# Patient Record
Sex: Female | Born: 2000 | Race: White | Hispanic: No | Marital: Single | Smoking: Never smoker
Health system: Southern US, Community
[De-identification: ages and names within clinical notes are randomized; demographics above are authoritative.]

## PROBLEM LIST (undated history)

## (undated) DIAGNOSIS — Q676 Pectus excavatum: Secondary | ICD-10-CM

## (undated) DIAGNOSIS — F840 Autistic disorder: Secondary | ICD-10-CM

## (undated) DIAGNOSIS — F419 Anxiety disorder, unspecified: Secondary | ICD-10-CM

## (undated) HISTORY — DX: Autistic disorder: F84.0

## (undated) HISTORY — DX: Anxiety disorder, unspecified: F41.9

---
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
## 2022-03-15 NOTE — Progress Notes (Signed)
BP 96/64   Pulse 62   Temp 98 F (36.7 C) (Oral)   Ht 5' 1.42" (1.56 m)   Wt 99 lb (44.9 kg)   LMP 02/28/2022 (Approximate)   SpO2 100%   BMI 18.45 kg/m    Subjective:    Patient ID: Rhonda Fox, female    DOB: 10/07/2000, 21 y.o.   MRN: [id number]  HPI: Rhonda Fox is a 21 y.o. female  Chief Complaint  Patient presents with   Establish Care    Patient would like to discuss fatigue and constant bodyaches   Patient presents to clinic to establish care with new PCP.  Introduced to Publishing rights manager role and practice setting.  All questions answered.  Discussed provider/patient relationship and expectations.  Patient reports a history of Depression and anxiety, headaches.  Patient denies a history of: Hypertension, Elevated Cholesterol, Diabetes, Thyroid problems, Depression, Anxiety, Neurological problems, and Abdominal problems.   FATIGUE Duration:   years Severity: 8/10  Onset: gradual Context when symptoms started:  unknown Symptoms improve with rest: no  Depressive symptoms: yes Stress/anxiety: yes Insomnia: yes  hard to fall asleep and stay asleep Snoring: no Observed apnea by bed partner: no Daytime hypersomnolence:yes Wakes feeling refreshed: no History of sleep study: no Dysnea on exertion:   possible asthma Orthopnea/PND: no Chest pain: no Chronic cough: yes Lower extremity edema: no Arthralgias:yes Myalgias: yes Weakness: no Rash: no Denies weight loss or weight gain. Has a family history of  Has been in psych facility 2-3 times. Has possible diagnosis of autism.  Patient has been evaluated by psychiatry as a 32 year old.  Mom states she was on Abilify in the past and it caused her to hallucinate.  States they thought she was schizophrenic.     Active Ambulatory Problems    Diagnosis Date Noted   Depression, recurrent (HCC) 03/16/2022   Other fatigue 03/16/2022   Resolved Ambulatory Problems    Diagnosis Date Noted   No Resolved  Ambulatory Problems   Past Medical History:  Diagnosis Date   Pectus excavatum    History reviewed. No pertinent surgical history. History reviewed. No pertinent family history.   Review of Systems  Constitutional:  Positive for fatigue.  Musculoskeletal:  Positive for arthralgias and myalgias.  Psychiatric/Behavioral:  Positive for dysphoric mood and sleep disturbance. Negative for suicidal ideas. The patient is nervous/anxious.     Per HPI unless specifically indicated above     Objective:    BP 96/64   Pulse 62   Temp 98 F (36.7 C) (Oral)   Ht 5' 1.42" (1.56 m)   Wt 99 lb (44.9 kg)   LMP 02/28/2022 (Approximate)   SpO2 100%   BMI 18.45 kg/m   Wt Readings from Last 3 Encounters:  03/16/22 99 lb (44.9 kg) (3 %, Z= -1.86)*  09/04/21 96 lb 6.4 oz (43.7 kg) (2 %, Z= -2.07)*  05/14/21 92 lb (41.7 kg) (<1 %, Z= -2.51)*   * Growth percentiles are based on CDC (Girls, 2-20 Years) data.    Physical Exam Vitals and nursing note reviewed.  Constitutional:      General: She is not in acute distress.    Appearance: Normal appearance. She is normal weight. She is not ill-appearing, toxic-appearing or diaphoretic.  HENT:     Head: Normocephalic.     Right Ear: External ear normal.     Left Ear: External ear normal.     Nose: Nose normal.  Mouth/Throat:     Mouth: Mucous membranes are moist.     Pharynx: Oropharynx is clear.  Eyes:     General:        Right eye: No discharge.        Left eye: No discharge.     Extraocular Movements: Extraocular movements intact.     Conjunctiva/sclera: Conjunctivae normal.     Pupils: Pupils are equal, round, and reactive to light.  Cardiovascular:     Rate and Rhythm: Normal rate and regular rhythm.     Heart sounds: No murmur heard. Pulmonary:     Effort: Pulmonary effort is normal. No respiratory distress.     Breath sounds: Normal breath sounds. No wheezing or rales.  Musculoskeletal:     Cervical back: Normal range of  motion and neck supple.  Skin:    General: Skin is warm and dry.     Capillary Refill: Capillary refill takes less than 2 seconds.  Neurological:     General: No focal deficit present.     Mental Status: She is alert and oriented to person, place, and time. Mental status is at baseline.  Psychiatric:        Mood and Affect: Mood normal.        Behavior: Behavior normal.        Thought Content: Thought content normal.        Judgment: Judgment normal.     Results for orders placed or performed in visit on 10/06/21  Hepatitis B surface antigen  Result Value Ref Range   Hepatitis B Surface Ag      surface antigen non reactive, surface antibody reactive      Assessment & Plan:   Problem List Items Addressed This Visit       Other   Depression, recurrent ([place]) - Primary    Chronic. Not well controlled.  Patient has had multiple pysch diagnosis over the years.  Will refer to psychiatry for formal evaluation and treatment.  Follow up in 1 month. Call sooner if concerns arise.       Relevant Medications   traZODone (DESYREL) 50 MG tablet   Other Relevant Orders   Ambulatory referral to Psychiatry   Other fatigue    On going concern.  Has been happening for several years.  Patient does not have a good schedule.  Encouraged patient to get on regular schedule to help with sleep.  Will try Trazadone 50mg  nightly.  Discussed side effects and benefits of medication discussed during visit. Discussed risk of Suicidal Ideations. Labs ordered at visit today.  Follow up in 1 month.  Call sooner if concerns arise.       Relevant Orders   Rheumatoid factor   ANA   ANA+ENA+DNA/DS+Antich+Centr   CK   Rocky mtn spotted fvr abs pnl(IgG+IgM)   Uric acid   Ehrlichia antibody panel   Babesia microti Antibody Panel   Comprehensive metabolic panel   CBC with Differential/Platelet   Lyme Disease Serology w/Reflex   Thyroid peroxidase antibody   Vitamin D (25 hydroxy)   Other Visit Diagnoses      Myalgia       Labs ordered at visit today. Follow up in 1 month. Call sooner if concerns arise.   Relevant Orders   Rheumatoid factor   ANA   ANA+ENA+DNA/DS+Antich+Centr   CK   Rocky mtn spotted fvr abs pnl(IgG+IgM)   Uric acid   Ehrlichia antibody panel   Babesia microti Antibody Panel  Comprehensive metabolic panel   CBC with Differential/Platelet   Lyme Disease Serology w/Reflex   Thyroid Panel With TSH   Thyroid peroxidase antibody   Vitamin D (25 hydroxy)   Arthralgia, unspecified joint       Labs ordered at visit today. Follow up in 1 month. Call sooner if concerns arise.   Relevant Orders   Rheumatoid factor   ANA   ANA+ENA+DNA/DS+Antich+Centr   CK   Rocky mtn spotted fvr abs pnl(IgG+IgM)   Uric acid   Ehrlichia antibody panel   Babesia microti Antibody Panel   Comprehensive metabolic panel   CBC with Differential/Platelet   Lyme Disease Serology w/Reflex   Thyroid Panel With TSH   Thyroid peroxidase antibody   Vitamin D (25 hydroxy)   Encounter to establish care            Follow up plan: Return in about 1 month (around 04/15/2022) for Physical and Fasting labs.

## 2022-03-16 ENCOUNTER — Ambulatory Visit (INDEPENDENT_AMBULATORY_CARE_PROVIDER_SITE_OTHER): Payer: Medicaid Other | Admitting: Nurse Practitioner

## 2022-03-16 ENCOUNTER — Encounter: Payer: Self-pay | Admitting: Nurse Practitioner

## 2022-03-16 VITALS — BP 96/64 | HR 62 | Temp 98.0°F | Ht 61.42 in | Wt 99.0 lb

## 2022-03-16 DIAGNOSIS — R5383 Other fatigue: Secondary | ICD-10-CM | POA: Insufficient documentation

## 2022-03-16 DIAGNOSIS — F339 Major depressive disorder, recurrent, unspecified: Secondary | ICD-10-CM | POA: Diagnosis not present

## 2022-03-16 DIAGNOSIS — M2550 Pain in unspecified joint: Secondary | ICD-10-CM | POA: Diagnosis not present

## 2022-03-16 DIAGNOSIS — M791 Myalgia, unspecified site: Secondary | ICD-10-CM

## 2022-03-16 DIAGNOSIS — Z7689 Persons encountering health services in other specified circumstances: Secondary | ICD-10-CM

## 2022-03-16 MED ORDER — TRAZODONE HCL 50 MG PO TABS: 25.0000 mg | ORAL_TABLET | Freq: Every evening | ORAL | 1 refills | Status: DC | PRN

## 2022-03-16 NOTE — Assessment & Plan Note (Signed)
Chronic. Not well controlled.  Patient has had multiple pysch diagnosis over the years.  Will refer to psychiatry for formal evaluation and treatment.  Follow up in 1 month. Call sooner if concerns arise.

## 2022-03-16 NOTE — Assessment & Plan Note (Signed)
On going concern.  Has been happening for several years.  Patient does not have a good schedule.  Encouraged patient to get on regular schedule to help with sleep.  Will try Trazadone 50mg  nightly.  Discussed side effects and benefits of medication discussed during visit. Discussed risk of Suicidal Ideations. Labs ordered at visit today.  Follow up in 1 month.  Call sooner if concerns arise.

## 2022-03-17 ENCOUNTER — Encounter: Payer: Self-pay | Admitting: Nurse Practitioner

## 2022-03-20 MED ORDER — DOXYCYCLINE HYCLATE 100 MG PO TABS: 100.0000 mg | ORAL_TABLET | Freq: Two times a day (BID) | ORAL | 0 refills | Status: DC

## 2022-03-20 NOTE — Progress Notes (Signed)
Hi Rhonda Fox.  Some of your lab work hasn't come back yet.  However, the ones that are back show that you have a low vitamin D level.  I want you to start Vitamin D 1000 IU over the counter daily.   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  This is likely a big source of your symptoms.  I have sent in a prescription for Doxycyline to help treat this. You will take it twice a day for 21 days.  Make sure to take it with food.    The other labs are unremarkable and don't explain your symptoms.  There does not appear to be any autoimmune disorder going on.

## 2022-03-20 NOTE — Addendum Note (Signed)
Addended by: Jon Billings on: 03/20/2022 07:52 AM   Modules accepted: Orders

## 2022-03-21 NOTE — Progress Notes (Signed)
Rhonda Fox. The rest of your lab work is unremarkable.  Continue with the antibiotics as discussed.

## 2022-03-22 LAB — THYROID PANEL WITH TSH
Free Thyroxine Index: 1.9 (ref 1.2–4.9)
T3 Uptake Ratio: 25 % (ref 24–39)
T4, Total: 7.7 ug/dL (ref 4.5–12.0)
TSH: 3.6 u[IU]/mL (ref 0.450–4.500)

## 2022-03-22 LAB — COMPREHENSIVE METABOLIC PANEL
ALT: 29 IU/L (ref 0–32)
AST: 25 IU/L (ref 0–40)
Albumin/Globulin Ratio: 2 (ref 1.2–2.2)
Albumin: 5 g/dL (ref 4.0–5.0)
Alkaline Phosphatase: 72 IU/L (ref 42–106)
BUN/Creatinine Ratio: 19 (ref 9–23)
BUN: 10 mg/dL (ref 6–20)
Bilirubin Total: <0.2 mg/dL (ref 0.0–1.2)
CO2: 23 mmol/L (ref 20–29)
Calcium: 10 mg/dL (ref 8.7–10.2)
Chloride: 102 mmol/L (ref 96–106)
Creatinine, Ser: 0.54 mg/dL — ABNORMAL LOW (ref 0.57–1.00)
Globulin, Total: 2.5 g/dL (ref 1.5–4.5)
Glucose: 87 mg/dL (ref 70–99)
Potassium: 4.3 mmol/L (ref 3.5–5.2)
Sodium: 139 mmol/L (ref 134–144)
Total Protein: 7.5 g/dL (ref 6.0–8.5)
eGFR: 136 mL/min/{1.73_m2} (ref >59)

## 2022-03-22 LAB — ANA+ENA+DNA/DS+ANTICH+CENTR
ANA Titer 1: NEGATIVE
Anti JO-1: <0.2 AI (ref 0.0–0.9)
Centromere Ab Screen: <0.2 AI (ref 0.0–0.9)
Chromatin Ab SerPl-aCnc: <0.2 AI (ref 0.0–0.9)
ENA RNP Ab: 0.3 AI (ref 0.0–0.9)
ENA SM Ab Ser-aCnc: <0.2 AI (ref 0.0–0.9)
ENA SSA (RO) Ab: <0.2 AI (ref 0.0–0.9)
ENA SSB (LA) Ab: <0.2 AI (ref 0.0–0.9)
Scleroderma (Scl-70) (ENA) Antibody, IgG: <0.2 AI (ref 0.0–0.9)
dsDNA Ab: 1 IU/mL (ref 0–9)

## 2022-03-22 LAB — CBC WITH DIFFERENTIAL/PLATELET
Basophils Absolute: 0 10*3/uL (ref 0.0–0.2)
Basos: 0 %
EOS (ABSOLUTE): 0.1 10*3/uL (ref 0.0–0.4)
Eos: 2 %
Hematocrit: 39.6 % (ref 34.0–46.6)
Hemoglobin: 13.5 g/dL (ref 11.1–15.9)
Immature Grans (Abs): 0 10*3/uL (ref 0.0–0.1)
Immature Granulocytes: 0 %
Lymphocytes Absolute: 1.6 10*3/uL (ref 0.7–3.1)
Lymphs: 32 %
MCH: 33.1 pg — ABNORMAL HIGH (ref 26.6–33.0)
MCHC: 34.1 g/dL (ref 31.5–35.7)
MCV: 97 fL (ref 79–97)
Monocytes Absolute: 0.4 10*3/uL (ref 0.1–0.9)
Monocytes: 9 %
Neutrophils Absolute: 2.8 10*3/uL (ref 1.4–7.0)
Neutrophils: 57 %
Platelets: 272 10*3/uL (ref 150–450)
RBC: 4.08 x10E6/uL (ref 3.77–5.28)
RDW: 10.6 % — ABNORMAL LOW (ref 11.7–15.4)
WBC: 5 10*3/uL (ref 3.4–10.8)

## 2022-03-22 LAB — RHEUMATOID FACTOR: Rheumatoid fact SerPl-aCnc: <10 IU/mL (ref <14.0)

## 2022-03-22 LAB — URIC ACID: Uric Acid: 3.4 mg/dL (ref 2.6–6.2)

## 2022-03-22 LAB — BABESIA MICROTI ANTIBODY PANEL
Babesia microti IgG: <1:10 {titer}
Babesia microti IgM: <1:10 {titer}

## 2022-03-22 LAB — ANA: Anti Nuclear Antibody (ANA): NEGATIVE

## 2022-03-22 LAB — CK: Total CK: 27 U/L — ABNORMAL LOW (ref 32–182)

## 2022-03-22 LAB — EHRLICHIA ANTIBODY PANEL
E. Chaffeensis (HME) IgM Titer: NEGATIVE
E.Chaffeensis (HME) IgG: NEGATIVE
HGE IgG Titer: NEGATIVE
HGE IgM Titer: NEGATIVE

## 2022-03-22 LAB — VITAMIN D 25 HYDROXY (VIT D DEFICIENCY, FRACTURES): Vit D, 25-Hydroxy: 22.8 ng/mL — ABNORMAL LOW (ref 30.0–100.0)

## 2022-03-22 LAB — ROCKY MTN SPOTTED FVR ABS PNL(IGG+IGM)
RMSF IgG: NEGATIVE
RMSF IgM: 1.46 index — ABNORMAL HIGH (ref 0.00–0.89)

## 2022-03-22 LAB — THYROID PEROXIDASE ANTIBODY: Thyroperoxidase Ab SerPl-aCnc: 10 IU/mL (ref 0–26)

## 2022-03-22 LAB — LYME DISEASE SEROLOGY W/REFLEX: Lyme Total Antibody EIA: NEGATIVE

## 2022-03-30 ENCOUNTER — Encounter: Payer: Self-pay | Admitting: Nurse Practitioner

## 2022-04-04 ENCOUNTER — Ambulatory Visit: Payer: Self-pay | Admitting: *Deleted

## 2022-04-04 NOTE — Telephone Encounter (Signed)
Summary: Rocky mt spotted fever   Pt has Rocky mt spotted fever / experiencing body aches / pain / stated she can't function       Chief Complaint: worsening sx unable to perform ADLs. Difficulty lifting arms. Symptoms: dx Rocky mtn spotted fever and sx of joint pain esp. Shoulders worsening over the past 3 weeks . Unable to lift arms to perform simple ADLs. Requires moderate assist to brush teeth, hair eat. Difficulty picking up objects. Can walk but  difficulty ambulating to the bathroom. Voice hoarse. Unable to go to work or school for 3 weeks.  Frequency: worsening x 3 weeks  Pertinent Negatives: Patient denies chest pain no difficulty breathing no fever.  Disposition: [x] ED /[] Urgent Care (no appt availability in office) / [] Appointment(In office/virtual)/ []  [facility]/ [] Home Care/ [] Refused Recommended Disposition /[] [facility]/ []  Follow-up with PCP Additional Notes:   Recommended ED due to worsening sx and patient now unable to perform ADLs with UEs. Still able to walk but with difficulty.       Reason for Disposition  [1] SEVERE pain (e.g., excruciating, unable to do any normal activities) AND [2] not improved 2 hours after pain medicine  Answer Assessment - Initial Assessment Questions 1. ONSET: "When did the muscle aches or body pains start?"      3 weeks ago  2. LOCATION: "What part of your body is hurting?" (e.g., entire body, arms, legs)      All over shoulder pain, aches and pains in joints, difficulty lifting arms to brush teethe or do ADLs.  3. SEVERITY: "How bad is the pain?" (Scale 1-10; or mild, moderate, severe)   - MILD (1-3): doesn't interfere with normal activities    - MODERATE (4-7): interferes with normal activities or awakens from sleep    - SEVERE (8-10):  excruciating pain, unable to do any normal activities      Moderate to severe pain gradually getting worse 4. CAUSE: "What do you think is causing the pains?"     Dx Rocky  mountain spotted fever 5. FEVER: "Have you been having fever?"     no 6. OTHER SYMPTOMS: "Do you have any other symptoms?" (e.g., chest pain, weakness, rash, cold or flu symptoms, weight loss)     Possible weight loss, difficulty with ADLs. Weakness in all extremities  7. PREGNANCY: "Is there any chance you are pregnant?" "When was your last menstrual period?"     na 8. TRAVEL: "Have you traveled out of the country in the last month?" (e.g., travel history, exposures)     na  Protocols used: Muscle Aches and Body Pain-A-AH

## 2022-04-04 NOTE — Telephone Encounter (Signed)
Summary: Rocky mt spotted fever   Pt has Rocky mt spotted fever / experiencing body aches / pain / stated she can't function      Called patient to review sx of body aches and unable to function. No answer, unable to leave message, MV full.

## 2022-04-04 NOTE — Telephone Encounter (Signed)
Attempted to call patient- no answer and mailbox is full- unable to leave call back message.

## 2022-04-09 NOTE — Telephone Encounter (Signed)
Called and spoke to patient. She states she is feeling a little bit better, states she has been soaking in epsom salt bathes. Trying to hold out until appointment on 04/17/22 but states she will call if she needs to be seen sooner.

## 2022-04-09 NOTE — Telephone Encounter (Signed)
Patient did not go to ED.  Can we follow up as see how patient is feeling?

## 2022-04-11 ENCOUNTER — Ambulatory Visit: Payer: Self-pay | Admitting: Nurse Practitioner

## 2022-04-17 ENCOUNTER — Ambulatory Visit (INDEPENDENT_AMBULATORY_CARE_PROVIDER_SITE_OTHER): Payer: Medicaid Other | Admitting: Nurse Practitioner

## 2022-04-17 ENCOUNTER — Encounter: Payer: Self-pay | Admitting: Nurse Practitioner

## 2022-04-17 VITALS — BP 106/67 | HR 80 | Temp 98.4°F | Ht 60.2 in | Wt 100.9 lb

## 2022-04-17 DIAGNOSIS — Z23 Encounter for immunization: Secondary | ICD-10-CM

## 2022-04-17 DIAGNOSIS — G8929 Other chronic pain: Secondary | ICD-10-CM

## 2022-04-17 DIAGNOSIS — M545 Low back pain, unspecified: Secondary | ICD-10-CM

## 2022-04-17 DIAGNOSIS — Z118 Encounter for screening for other infectious and parasitic diseases: Secondary | ICD-10-CM

## 2022-04-17 DIAGNOSIS — Z0000 Encounter for general adult medical examination without abnormal findings: Secondary | ICD-10-CM

## 2022-04-17 DIAGNOSIS — E559 Vitamin D deficiency, unspecified: Secondary | ICD-10-CM

## 2022-04-17 DIAGNOSIS — Z136 Encounter for screening for cardiovascular disorders: Secondary | ICD-10-CM

## 2022-04-17 LAB — URINALYSIS, ROUTINE W REFLEX MICROSCOPIC
Bilirubin, UA: NEGATIVE
Glucose, UA: NEGATIVE
Ketones, UA: NEGATIVE
Leukocytes,UA: NEGATIVE
Nitrite, UA: NEGATIVE
Protein,UA: NEGATIVE
Specific Gravity, UA: 1.025 (ref 1.005–1.030)
Urobilinogen, Ur: 0.2 mg/dL (ref 0.2–1.0)
pH, UA: 5 (ref 5.0–7.5)

## 2022-04-17 LAB — MICROSCOPIC EXAMINATION: Bacteria, UA: NONE SEEN

## 2022-04-17 MED ORDER — VITAMIN D3 25 MCG (1000 UT) PO CAPS: 1000.0000 [IU] | ORAL_CAPSULE | Freq: Every day | ORAL | 1 refills | Status: DC

## 2022-04-17 NOTE — Progress Notes (Unsigned)
 BP 106/67   Pulse 80   Temp 98.4 F (36.9 C) (Oral)   Ht 5' 0.2" (1.529 m)   Wt 100 lb 14.4 oz (45.8 kg)   LMP 04/15/2022 (Exact Date)   SpO2 94%   BMI 19.57 kg/m    Subjective:    Patient ID: Rhonda Fox, female    DOB: 02/04/2001, 21 y.o.   MRN: [id number]  HPI: Rhonda Fox is a 21 y.o. female presenting on 04/17/2022 for comprehensive medical examination. Current medical complaints include: body aches  She currently lives with: Menopausal Symptoms: no  Patient states she has been having a lot of back pain recently.  She has a hard time sitting straight up.  Has a hard time getting comfortable.     Depression Screen done today and results listed below:     03/16/2022    9:53 AM 09/04/2021    3:04 PM  Depression screen PHQ 2/9  Decreased Interest 3 0  Down, Depressed, Hopeless 3 0  PHQ - 2 Score 6 0  Altered sleeping 3   Tired, decreased energy 3   Change in appetite 3   Feeling bad or failure about yourself  3   Trouble concentrating 1   Moving slowly or fidgety/restless 2   Suicidal thoughts 1   PHQ-9 Score 22   Difficult doing work/chores Not difficult at all     The patient does not have a history of falls. I did complete a risk assessment for falls. A plan of care for falls was documented.   Past Medical History:  Past Medical History:  Diagnosis Date   Pectus excavatum     Surgical History:  History reviewed. No pertinent surgical history.  Medications:  Current Outpatient Medications on File Prior to Visit  Medication Sig   doxycycline (VIBRA-TABS) 100 MG tablet Take 1 tablet (100 mg total) by mouth 2 (two) times daily.   traZODone (DESYREL) 50 MG tablet Take 0.5-1 tablets (25-50 mg total) by mouth at bedtime as needed for sleep.   No current facility-administered medications on file prior to visit.    Allergies:  Allergies  Allergen Reactions   Kiwi Extract Anaphylaxis    Social History:  Social History   Socioeconomic  History   Marital status: Single    Spouse name: Not on file   Number of children: Not on file   Years of education: Not on file   Highest education level: Not on file  Occupational History   Not on file  Tobacco Use   Smoking status: Never   Smokeless tobacco: Never  Vaping Use   Vaping Use: Never used  Substance and Sexual Activity   Alcohol use: Yes    Comment: Socially   Drug use: Yes    Types: Marijuana   Sexual activity: Not Currently    Partners: Male  Other Topics Concern   Not on file  Social History Narrative   Not on file   Social Determinants of Health   Financial Resource Strain: Not on file  Food Insecurity: Not on file  Transportation Needs: Not on file  Physical Activity: Not on file  Stress: Not on file  Social Connections: Not on file  Intimate Partner Violence: Not At Risk (09/04/2021)   Humiliation, Afraid, Rape, and Kick questionnaire    Fear of Current or Ex-Partner: No    Emotionally Abused: No    Physically Abused: No    Sexually Abused: No   Social History     Tobacco Use  Smoking Status Never  Smokeless Tobacco Never   Social History   Substance and Sexual Activity  Alcohol Use Yes   Comment: Socially    Family History:  History reviewed. No pertinent family history.  Past medical history, surgical history, medications, allergies, family history and social history reviewed with patient today and changes made to appropriate areas of the chart.   ROS All other ROS negative except what is listed above and in the HPI.      Objective:    BP 106/67   Pulse 80   Temp 98.4 F (36.9 C) (Oral)   Ht 5' 0.2" (1.529 m)   Wt 100 lb 14.4 oz (45.8 kg)   LMP 04/15/2022 (Exact Date)   SpO2 94%   BMI 19.57 kg/m   Wt Readings from Last 3 Encounters:  04/17/22 100 lb 14.4 oz (45.8 kg) (4 %, Z= -1.70)*  03/16/22 99 lb (44.9 kg) (3 %, Z= -1.86)*  09/04/21 96 lb 6.4 oz (43.7 kg) (2 %, Z= -2.07)*   * Growth percentiles are based on CDC  (Girls, 2-20 Years) data.    Physical Exam  Results for orders placed or performed in visit on 03/16/22  Rheumatoid factor  Result Value Ref Range   Rhuematoid fact SerPl-aCnc <10.0 <14.0 IU/mL  ANA  Result Value Ref Range   Anti Nuclear Antibody (ANA) Negative Negative  ANA+ENA+DNA/DS+Antich+Centr  Result Value Ref Range   ANA Titer 1 Negative    dsDNA Ab 1 0 - 9 IU/mL   ENA RNP Ab 0.3 0.0 - 0.9 AI   ENA SM Ab Ser-aCnc <0.2 0.0 - 0.9 AI   Scleroderma (Scl-70) (ENA) Antibody, IgG <0.2 0.0 - 0.9 AI   ENA SSA (RO) Ab <0.2 0.0 - 0.9 AI   ENA SSB (LA) Ab <0.2 0.0 - 0.9 AI   Chromatin Ab SerPl-aCnc <0.2 0.0 - 0.9 AI   Anti JO-1 <0.2 0.0 - 0.9 AI   Centromere Ab Screen <0.2 0.0 - 0.9 AI   See below: Comment   CK  Result Value Ref Range   Total CK 27 (L) 32 - 182 U/L  Rocky mtn spotted fvr abs pnl(IgG+IgM)  Result Value Ref Range   RMSF IgG Negative Negative   RMSF IgM 1.46 (H) 0.00 - 0.89 index  Uric acid  Result Value Ref Range   Uric Acid 3.4 2.6 - 6.2 mg/dL  Ehrlichia antibody panel  Result Value Ref Range   E.Chaffeensis (HME) IgG Negative Neg:<1:64   E. Chaffeensis (HME) IgM Titer Negative Neg:<1:20   HGE IgG Titer Negative Neg:<1:64   HGE IgM Titer Negative Neg:<1:20   Result Comment: Comment   Babesia microti Antibody Panel  Result Value Ref Range   Babesia microti IgG <1:10 Neg:<1:10   Babesia microti IgM <1:10 Neg:<1:10   Result Comment: Comment   Comprehensive metabolic panel  Result Value Ref Range   Glucose 87 70 - 99 mg/dL   BUN 10 6 - 20 mg/dL   Creatinine, Ser 0.54 (L) 0.57 - 1.00 mg/dL   eGFR 136 >59 mL/min/1.73   BUN/Creatinine Ratio 19 9 - 23   Sodium 139 134 - 144 mmol/L   Potassium 4.3 3.5 - 5.2 mmol/L   Chloride 102 96 - 106 mmol/L   CO2 23 20 - 29 mmol/L   Calcium 10.0 8.7 - 10.2 mg/dL   Total Protein 7.5 6.0 - 8.5 g/dL   Albumin 5.0 4.0 - 5.0 g/dL   Globulin, Total 2.5   1.5 - 4.5 g/dL   Albumin/Globulin Ratio 2.0 1.2 - 2.2   Bilirubin  Total <0.2 0.0 - 1.2 mg/dL   Alkaline Phosphatase 72 42 - 106 IU/L   AST 25 0 - 40 IU/L   ALT 29 0 - 32 IU/L  CBC with Differential/Platelet  Result Value Ref Range   WBC 5.0 3.4 - 10.8 x10E3/uL   RBC 4.08 3.77 - 5.28 x10E6/uL   Hemoglobin 13.5 11.1 - 15.9 g/dL   Hematocrit 39.6 34.0 - 46.6 %   MCV 97 79 - 97 fL   MCH 33.1 (H) 26.6 - 33.0 pg   MCHC 34.1 31.5 - 35.7 g/dL   RDW 10.6 (L) 11.7 - 15.4 %   Platelets 272 150 - 450 x10E3/uL   Neutrophils 57 Not Estab. %   Lymphs 32 Not Estab. %   Monocytes 9 Not Estab. %   Eos 2 Not Estab. %   Basos 0 Not Estab. %   Neutrophils Absolute 2.8 1.4 - 7.0 x10E3/uL   Lymphocytes Absolute 1.6 0.7 - 3.1 x10E3/uL   Monocytes Absolute 0.4 0.1 - 0.9 x10E3/uL   EOS (ABSOLUTE) 0.1 0.0 - 0.4 x10E3/uL   Basophils Absolute 0.0 0.0 - 0.2 x10E3/uL   Immature Granulocytes 0 Not Estab. %   Immature Grans (Abs) 0.0 0.0 - 0.1 x10E3/uL  Lyme Disease Serology w/Reflex  Result Value Ref Range   Lyme Total Antibody EIA Negative Negative  Thyroid Panel With TSH  Result Value Ref Range   TSH 3.600 0.450 - 4.500 uIU/mL   T4, Total 7.7 4.5 - 12.0 ug/dL   T3 Uptake Ratio 25 24 - 39 %   Free Thyroxine Index 1.9 1.2 - 4.9  Thyroid peroxidase antibody  Result Value Ref Range   Thyroperoxidase Ab SerPl-aCnc 10 0 - 26 IU/mL  Vitamin D (25 hydroxy)  Result Value Ref Range   Vit D, 25-Hydroxy 22.8 (L) 30.0 - 100.0 ng/mL      Assessment & Plan:   Problem List Items Addressed This Visit   None Visit Diagnoses     Annual physical exam    -  Primary   Screening for ischemic heart disease       Screening for chlamydial disease            Follow up plan: No follow-ups on file.   LABORATORY TESTING:  - Pap smear: not applicable  IMMUNIZATIONS:   - Tdap: Tetanus vaccination status reviewed: last tetanus booster within 10 years. - Influenza: Refused - Pneumovax: Not applicable - Prevnar: Not applicable - COVID: Not applicable - HPV: Administered  today - Shingrix vaccine: Not applicable  SCREENING: -Mammogram: Not applicable  - Colonoscopy: Not applicable  - Bone Density: Not applicable  -Hearing Test: Not applicable  -Spirometry: Not applicable   PATIENT COUNSELING:   Advised to take 1 mg of folate supplement per day if capable of pregnancy.   Sexuality: Discussed sexually transmitted diseases, partner selection, use of condoms, avoidance of unintended pregnancy  and contraceptive alternatives.   Advised to avoid cigarette smoking.  I discussed with the patient that most people either abstain from alcohol or drink within safe limits (<=14/week and <=4 drinks/occasion for males, <=7/weeks and <= 3 drinks/occasion for females) and that the risk for alcohol disorders and other health effects rises proportionally with the number of drinks per week and how often a drinker exceeds daily limits.  Discussed cessation/primary prevention of drug use and availability of treatment for abuse.   Diet: Encouraged   to adjust caloric intake to maintain  or achieve ideal body weight, to reduce intake of dietary saturated fat and total fat, to limit sodium intake by avoiding high sodium foods and not adding table salt, and to maintain adequate dietary potassium and calcium preferably from fresh fruits, vegetables, and low-fat dairy products.    stressed the importance of regular exercise  Injury prevention: Discussed safety belts, safety helmets, smoke detector, smoking near bedding or upholstery.   Dental health: Discussed importance of regular tooth brushing, flossing, and dental visits.    NEXT PREVENTATIVE PHYSICAL DUE IN 1 YEAR. No follow-ups on file.

## 2022-04-18 LAB — COMPREHENSIVE METABOLIC PANEL
ALT: 14 IU/L (ref 0–32)
AST: 16 IU/L (ref 0–40)
Albumin/Globulin Ratio: 1.8 (ref 1.2–2.2)
Albumin: 4.8 g/dL (ref 4.0–5.0)
Alkaline Phosphatase: 63 IU/L (ref 42–106)
BUN/Creatinine Ratio: 20 (ref 9–23)
BUN: 11 mg/dL (ref 6–20)
Bilirubin Total: 0.3 mg/dL (ref 0.0–1.2)
CO2: 23 mmol/L (ref 20–29)
Calcium: 9.8 mg/dL (ref 8.7–10.2)
Chloride: 104 mmol/L (ref 96–106)
Creatinine, Ser: 0.56 mg/dL — ABNORMAL LOW (ref 0.57–1.00)
Globulin, Total: 2.7 g/dL (ref 1.5–4.5)
Glucose: 80 mg/dL (ref 70–99)
Potassium: 4.1 mmol/L (ref 3.5–5.2)
Sodium: 140 mmol/L (ref 134–144)
Total Protein: 7.5 g/dL (ref 6.0–8.5)
eGFR: 135 mL/min/{1.73_m2} (ref >59)

## 2022-04-18 LAB — CBC WITH DIFFERENTIAL/PLATELET
Basophils Absolute: 0 10*3/uL (ref 0.0–0.2)
Basos: 1 %
EOS (ABSOLUTE): 0 10*3/uL (ref 0.0–0.4)
Eos: 1 %
Hematocrit: 37.3 % (ref 34.0–46.6)
Hemoglobin: 13 g/dL (ref 11.1–15.9)
Immature Grans (Abs): 0 10*3/uL (ref 0.0–0.1)
Immature Granulocytes: 0 %
Lymphocytes Absolute: 1.4 10*3/uL (ref 0.7–3.1)
Lymphs: 35 %
MCH: 32.9 pg (ref 26.6–33.0)
MCHC: 34.9 g/dL (ref 31.5–35.7)
MCV: 94 fL (ref 79–97)
Monocytes Absolute: 0.3 10*3/uL (ref 0.1–0.9)
Monocytes: 7 %
Neutrophils Absolute: 2.2 10*3/uL (ref 1.4–7.0)
Neutrophils: 56 %
Platelets: 227 10*3/uL (ref 150–450)
RBC: 3.95 x10E6/uL (ref 3.77–5.28)
RDW: 10.7 % — ABNORMAL LOW (ref 11.7–15.4)
WBC: 4 10*3/uL (ref 3.4–10.8)

## 2022-04-18 LAB — LIPID PANEL
Chol/HDL Ratio: 2.6 ratio (ref 0.0–4.4)
Cholesterol, Total: 158 mg/dL (ref 100–169)
HDL: 60 mg/dL (ref >39)
LDL Chol Calc (NIH): 86 mg/dL (ref 0–109)
Triglycerides: 61 mg/dL (ref 0–89)
VLDL Cholesterol Cal: 12 mg/dL (ref 5–40)

## 2022-04-18 LAB — TSH: TSH: 1.55 u[IU]/mL (ref 0.450–4.500)

## 2022-04-18 NOTE — Progress Notes (Signed)
Hi Mekenzie. It was nice to see you yesterday.  Your lab work looks good.  No concerns at this time. Continue with your current medication regimen.  Follow up as discussed.  Please let me know if you have any questions.

## 2022-04-19 LAB — GC/CHLAMYDIA PROBE AMP
Chlamydia trachomatis, NAA: NEGATIVE
Neisseria Gonorrhoeae by PCR: NEGATIVE

## 2022-05-18 ENCOUNTER — Ambulatory Visit: Payer: Medicaid Other

## 2022-05-29 ENCOUNTER — Ambulatory Visit (INDEPENDENT_AMBULATORY_CARE_PROVIDER_SITE_OTHER): Payer: Medicaid Other

## 2022-05-29 DIAGNOSIS — Z23 Encounter for immunization: Secondary | ICD-10-CM

## 2022-06-06 ENCOUNTER — Ambulatory Visit (INDEPENDENT_AMBULATORY_CARE_PROVIDER_SITE_OTHER): Payer: No Typology Code available for payment source | Admitting: Psychiatry

## 2022-06-06 ENCOUNTER — Encounter: Payer: Self-pay | Admitting: Psychiatry

## 2022-06-06 VITALS — BP 106/63 | HR 80 | Temp 98.5°F | Ht 60.2 in | Wt 100.1 lb

## 2022-06-06 DIAGNOSIS — R41840 Attention and concentration deficit: Secondary | ICD-10-CM | POA: Diagnosis not present

## 2022-06-06 DIAGNOSIS — F39 Unspecified mood [affective] disorder: Secondary | ICD-10-CM | POA: Insufficient documentation

## 2022-06-06 NOTE — Patient Instructions (Addendum)
[facility] Call for an Appointment [phone]  [email or web address] [facility] [address], [place], [place] [address]  [phone]    [email or web address]  [email or web address]  [email or web address] [address], [place], [place] [address]  [phone]  [facility], [email or web address] [address], [place], [place] [address]   [phone]   [facility] - [phone]  [facility] - [phone]  [facility] - [place] [phone]  [facility] - [phone]
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
## 2022-06-12 ENCOUNTER — Ambulatory Visit
Admission: EM | Admit: 2022-06-12 | Discharge: 2022-06-12 | Disposition: A | Discharge status: Home / Self Care | Payer: Medicaid Other | Attending: Physician Assistant | Admitting: Physician Assistant

## 2022-06-12 ENCOUNTER — Ambulatory Visit (INDEPENDENT_AMBULATORY_CARE_PROVIDER_SITE_OTHER): Payer: Medicaid Other

## 2022-06-12 ENCOUNTER — Telehealth: Payer: Self-pay

## 2022-06-12 ENCOUNTER — Encounter: Payer: Self-pay | Admitting: Emergency Medicine

## 2022-06-12 DIAGNOSIS — R5383 Other fatigue: Secondary | ICD-10-CM

## 2022-06-12 DIAGNOSIS — G8929 Other chronic pain: Secondary | ICD-10-CM

## 2022-06-12 MED ORDER — NAPROXEN 500 MG PO TABS: 500.0000 mg | ORAL_TABLET | Freq: Two times a day (BID) | ORAL | 1 refills | Status: DC | PRN

## 2022-06-12 NOTE — Telephone Encounter (Signed)
received fax from [facility] that they received the referral and they have mailed a intake packet to the family 06-12-22 and they will let you know if they are unable to reach the family.

## 2022-06-12 NOTE — ED Provider Notes (Signed)
[facility]    CSN: [id number] Arrival date & time: 06/12/22  1909      History   Chief Complaint Chief Complaint  Patient presents with   Generalized Body Aches    HPI Rhonda Fox is a 22 y.o. female presenting with her mother.  Patient is reporting sharp stabbing pains daily.  She says most of the pains are in her back and ribs.  She reports fatigue.  She also reports tenderness of her chest wall.  Symptoms have been ongoing for the past 5 months or more.  She has followed up with her primary care provider about her severe muscle pains and had labs performed.  She was last seen at the beginning of December of last year.  She says her primary is not really doing anything to help her.  She says that her pain is severe but she does not like taking medication so she has not taken recommended anti-inflammatory medications.  She says pain is a lot worse when she is at work and having to lift things and move around.  She apparently left work because she was having such severe pain in her back.  She also reports fatigue and feeling generally weak.  Mother reports that she was diagnosed with [facility] spotted fever in November and was treated with doxycycline.  She completed the full course.  Unsure of her current symptoms are related to that or not.  HPI  Past Medical History:  Diagnosis Date   Anxiety    Pectus excavatum     Patient Active Problem List   Diagnosis Date Noted   Episodic mood disorder (HCC) 06/06/2022   Attention and concentration deficit 06/06/2022   Depression, recurrent (HCC) 03/16/2022   Other fatigue 03/16/2022    History reviewed. No pertinent surgical history.  OB History     Gravida  0   Para  0   Term  0   Preterm  0   AB  0   Living  0      SAB  0   IAB  0   Ectopic  0   Multiple  0   Live Births  0            Home Medications    Prior to Admission medications   Medication Sig Start Date End Date Taking?  Authorizing Provider  naproxen (NAPROSYN) 500 MG tablet Take 1 tablet (500 mg total) by mouth 2 (two) times daily as needed for moderate pain. 06/12/22  Yes Shirlee Latch, PA-C  Cholecalciferol (VITAMIN D3) 25 MCG (1000 UT) CAPS Take 1 capsule (1,000 Units total) by mouth daily. 04/17/22   Larae Grooms, NP  doxycycline (VIBRA-TABS) 100 MG tablet Take 1 tablet (100 mg total) by mouth 2 (two) times daily. 03/20/22   Larae Grooms, NP  traZODone (DESYREL) 50 MG tablet Take 0.5-1 tablets (25-50 mg total) by mouth at bedtime as needed for sleep. 03/16/22   Larae Grooms, NP    Family History Family History  Problem Relation Age of Onset   Alcohol abuse Brother    Suicidality Paternal Uncle     Social History Social History   Tobacco Use   Smoking status: Never   Smokeless tobacco: Never  Vaping Use   Vaping Use: Never used  Substance Use Topics   Alcohol use: Yes    Comment: Socially   Drug use: Yes    Types: Marijuana     Allergies   Kiwi extract  Review of Systems Review of Systems  Constitutional:  Positive for fatigue. Negative for chills, diaphoresis and fever.  HENT:  Negative for congestion and sore throat.   Respiratory:  Negative for cough and shortness of breath.   Cardiovascular:  Negative for chest pain and palpitations.  Gastrointestinal:  Negative for abdominal pain, nausea and vomiting.  Musculoskeletal:  Positive for arthralgias, back pain and myalgias. Negative for gait problem, joint swelling, neck pain and neck stiffness.  Skin:  Negative for rash.  Neurological:  Negative for weakness and headaches.  Hematological:  Negative for adenopathy.     Physical Exam Triage Vital Signs ED Triage Vitals  Enc Vitals Group     BP      Pulse      Resp      Temp      Temp src      SpO2      Weight      Height      Head Circumference      Peak Flow      Pain Score      Pain Loc      Pain Edu?      Excl. in [place]?    No data found.  Updated  Vital Signs BP 120/81 (BP Location: Right Arm)   Pulse 81   Temp 99.1 F (37.3 C) (Oral)   Resp 16   Ht 5' (1.524 m)   Wt 100 lb 1.4 oz (45.4 kg)   LMP 05/17/2022 (Approximate)   SpO2 99%   BMI 19.55 kg/m     Physical Exam Vitals and nursing note reviewed.  Constitutional:      General: She is not in acute distress.    Appearance: Normal appearance. She is not ill-appearing or toxic-appearing.  HENT:     Head: Normocephalic and atraumatic.     Nose: Nose normal.     Mouth/Throat:     Mouth: Mucous membranes are moist.     Pharynx: Oropharynx is clear.  Eyes:     General: No scleral icterus.       Right eye: No discharge.        Left eye: No discharge.     Conjunctiva/sclera: Conjunctivae normal.  Cardiovascular:     Rate and Rhythm: Normal rate and regular rhythm.     Heart sounds: Normal heart sounds.  Pulmonary:     Effort: Pulmonary effort is normal. No respiratory distress.     Breath sounds: Normal breath sounds.  Chest:     Chest wall: Tenderness (diffuse chest wall TTP) present.  Musculoskeletal:        General: Tenderness (diffuse TTP bilateral ribs and throughout t-spine. Also TTP bilateral traps) present.     Cervical back: Neck supple.  Skin:    General: Skin is dry.  Neurological:     General: No focal deficit present.     Mental Status: She is alert. Mental status is at baseline.     Motor: No weakness.     Gait: Gait normal.  Psychiatric:        Mood and Affect: Mood is anxious. Affect is angry.        Speech: Speech is rapid and pressured.        Behavior: Behavior is agitated.      UC Treatments / Results  Labs (all labs ordered are listed, but only abnormal results are displayed) Labs Reviewed - No data to display   EKG   Radiology DG Chest  2 View  Result Date: 06/12/2022 CLINICAL DATA:  Bilateral rib pain EXAM: CHEST - 2 VIEW COMPARISON:  06/21/2015 FINDINGS: The heart size and mediastinal contours are within normal limits. Both  lungs are clear. The visualized skeletal structures are unremarkable. IMPRESSION: No active cardiopulmonary disease. Electronically Signed   By: Placido Sou M.D.   On: 06/12/2022 19:58    Procedures Procedures (including critical care time)  Medications Ordered in UC Medications - No data to display  Initial Impression / Assessment and Plan / UC Course  I have reviewed the triage vital signs and the nursing notes.  Pertinent labs & imaging results that were available during my care of the patient were reviewed by me and considered in my medical decision making (see chart for details).   22 year old female presents with mother for chronic greater than 69-month history of sharp stabbing back pains and rib pains.  She also reports fatigue and weakness.  She has followed up with her primary care provider about this at the end of last year and had lab workup.  She was diagnosed with recommend spotted fever in November and took treatment for that.  Today she says the back pain is worse while she was at work.  Has not taken anything for pain and normally does not.  Reviewed workup patient has had through PCP.  In April of last year she had testing for gonorrhea/committee, syphilis, HBV, HIV.  In November she had lab work including rheumatoid factor, ANA, CK, tickborne disease panel, uric acid, CBC, CMP, TSH and thyroid, vitamin D.  In December she had GC/chlamydia, urinalysis, CBC, CMP, lipid panel and TSH.  Labs do indicate RMSF IgM elevated in November.  Decreased vitamin D of 22.  Is currently on vitamin D supplement. Other labs without any major abnormality.  Patient does have a history of anxiety and mother says that she has a history of autism although patient denies this.  She follows up with behavioral health and actually had an appointment with behavioral health today.  Reviewed patient's behavioral health note.  The following taken from the behavioral health note from today " Patient with  multiple psychiatric diagnoses in the past including bipolar disorder, schizophrenia, oppositional defiant disorder, ADHD, possible autism spectrum disorder.  Patient with inpatient behavioral health admission x 2-at the age of [facility] [place], later on [facility].   Reviewed labs in the system including TSH, ANA, rheumatoid factor-dated 03/16/2022-within normal limits.  Mother to discuss with family provider regarding referral to a rheumatologist or specialty clinic for chronic fatigue and arthralgia.   Episodic mood disorder-rule out autism spectrum disorder/rule out ADHD-unstable Will refer for neuropsychological testing-I have sent referral to [facility] program. Also provided information for [facility] specialty clinic.  Mother agrees to contact them. Provided community resources for psychotherapy, while patient is waiting for testing she could start psychotherapy sessions. For sleep problems patient to start working on sleep hygiene since she does not have one at this time.  Discussed taking over-the-counter sleep medications like melatonin, Relaxium if needed."  Chest x-ray is normal. Reviewed results with patient's mother.   All of patient's issues are chronic.  No acute abnormalities today.  Vitals are all stable and she is overall stable.  Advised that she should take the anti-inflammatory medication as previously advised and prescribed today.  Sent naproxen to pharmacy.  Also advised she could use Tylenol, topical Voltaren gel or muscle rubs.  Advise heat, ice.  Advised her to reach  back out to her PCP for follow-up appointment or referral to rheumatologist or specialty clinic.  Continue to follow-up with behavioral health specialist.  Go to emergency department for any acute worsening of back pain or mental state.   Final Clinical Impressions(s) / UC Diagnoses   Final diagnoses:  Other chronic pain  Other fatigue     Discharge Instructions      BACK  PAIN: Stressed avoiding painful activities . RICE (REST, ICE, COMPRESSION, ELEVATION) guidelines reviewed. May alternate ice and heat. Consider use of muscle rubs, Salonpas patches, etc. Use medications as directed including muscle relaxers if prescribed. Take anti-inflammatory medications as prescribed or OTC NSAIDs/Tylenol.  F/u with PCP in 7-10 days for reexamination, and please feel free to call or return to the urgent care at any time for any questions or concerns you may have and we will be happy to help you!   BACK PAIN RED FLAGS: If the back pain acutely worsens or there are any red flag symptoms such as numbness/tingling, leg weakness, saddle anesthesia, or loss of bowel/bladder control, go immediately to the ER. Follow up with [place] as scheduled or sooner if the pain does not begin to resolve or if it worsens before the follow up    [place] PCP. CONTACT THEM ABOUT A REFERRAL TO A RHEUMATOLOGIST OR OTHER SPECIALTY CLINIC.      ED Prescriptions     Medication Sig Dispense Auth. Provider   naproxen (NAPROSYN) 500 MG tablet Take 1 tablet (500 mg total) by mouth 2 (two) times daily as needed for moderate pain. 30 tablet Gretta Cool      PDMP not reviewed this encounter.   Danton Clap, PA-C 06/12/22 2005

## 2022-06-12 NOTE — ED Triage Notes (Signed)
Pt body aches, fatigue and weakness. Pt states this has been going on for over 5 months. Mother states that it has been getting worse. She states pt has been getting weaker over the last year.

## 2022-06-12 NOTE — Telephone Encounter (Signed)
Noted  

## 2022-06-12 NOTE — Discharge Instructions (Signed)
BACK PAIN: Stressed avoiding painful activities . RICE (REST, ICE, COMPRESSION, ELEVATION) guidelines reviewed. May alternate ice and heat. Consider use of muscle rubs, Salonpas patches, etc. Use medications as directed including muscle relaxers if prescribed. Take anti-inflammatory medications as prescribed or OTC NSAIDs/Tylenol.  F/u with PCP in 7-10 days for reexamination, and please feel free to call or return to the urgent care at any time for any questions or concerns you may have and we will be happy to help you!   BACK PAIN RED FLAGS: If the back pain acutely worsens or there are any red flag symptoms such as numbness/tingling, leg weakness, saddle anesthesia, or loss of bowel/bladder control, go immediately to the ER. Follow up with [place] as scheduled or sooner if the pain does not begin to resolve or if it worsens before the follow up    [place] PCP. CONTACT THEM ABOUT A REFERRAL TO A RHEUMATOLOGIST OR OTHER SPECIALTY CLINIC.

## 2022-06-12 NOTE — Telephone Encounter (Signed)
referral was entered in epic and the form and responces were scanned into the referral.

## 2022-06-12 NOTE — Telephone Encounter (Signed)
dr. Shea Evans completed a referral form for [facility] for patient. form was faxed and confirmed to the [facility] program

## 2022-06-19 ENCOUNTER — Telehealth: Payer: Self-pay | Admitting: Nurse Practitioner

## 2022-06-19 DIAGNOSIS — R5383 Other fatigue: Secondary | ICD-10-CM

## 2022-06-19 NOTE — Telephone Encounter (Signed)
Copied from [place] [id number]. Topic: Referral - Request for Referral >> Jun 19, 2022  4:26 PM Ludger Nutting wrote: Has patient seen PCP for this complaint? Yes.   *If NO, is insurance requiring patient see PCP for this issue before PCP can refer them? Referral for which specialty: Rheumatology Preferred provider/office:  Reason for referral: Widespread pain and fatigue

## 2022-06-21 NOTE — Telephone Encounter (Signed)
Referral placed.

## 2022-06-22 NOTE — Telephone Encounter (Signed)
Patient notified that referral has been placed  

## 2022-06-25 ENCOUNTER — Telehealth: Payer: Self-pay

## 2022-06-25 NOTE — Telephone Encounter (Signed)
Received a fax from [facility] stating the following:  Per Rhonda Fox at [place]- pt had rocky spotted fever, all over pains, and fatigue.  Per our providers- needs to be referred to infectious disease, we do not treat that.  Routing to provider to advise.

## 2022-06-25 NOTE — Telephone Encounter (Signed)
Please let patient know that her referral to rheumatology was declined due to no abnormal rheumatology lab work.

## 2022-07-12 ENCOUNTER — Telehealth: Payer: Self-pay | Admitting: Nurse Practitioner

## 2022-07-12 NOTE — Telephone Encounter (Signed)
Copied from [place] [id number]. Topic: Referral - Status >> Jul 11, 2022  4:13 PM Rhonda Fox wrote: Reason for CRM: The patient has called regarding their recent referral for rheumatology   The patient has been told that their referral [id number] has not been received  The patient would like for their referral to be resubmitted if/when possible   Please contact further when possible

## 2022-07-13 NOTE — Telephone Encounter (Signed)
Can you check on this referral for the patient please?

## 2022-07-16 NOTE — Progress Notes (Signed)
There were no vitals taken for this visit.   Subjective:    Patient ID: Rhonda Fox, female    DOB: 2000/06/26, 22 y.o.   MRN: [id number]  HPI: Rhonda Fox is a 22 y.o. female  No chief complaint on file.  BODY ACHES   Relevant past medical, surgical, family and social history reviewed and updated as indicated. Interim medical history since our last visit reviewed. Allergies and medications reviewed and updated.  Review of Systems  Per HPI unless specifically indicated above     Objective:    There were no vitals taken for this visit.  Wt Readings from Last 3 Encounters:  06/12/22 100 lb 1.4 oz (45.4 kg) (4 %, Z= -1.78)*  04/17/22 100 lb 14.4 oz (45.8 kg) (4 %, Z= -1.70)*  03/16/22 99 lb (44.9 kg) (3 %, Z= -1.86)*   * Growth percentiles are based on CDC (Girls, 2-20 Years) data.    Physical Exam  Results for orders placed or performed in visit on 04/17/22  GC/Chlamydia Probe Amp   Specimen: Urine   UR  Result Value Ref Range   Chlamydia trachomatis, NAA Negative Negative   Neisseria Gonorrhoeae by PCR Negative Negative  Microscopic Examination   Urine  Result Value Ref Range   WBC, UA 0-5 0 - 5 /hpf   RBC, Urine 0-2 0 - 2 /hpf   Epithelial Cells (non renal) 0-10 0 - 10 /hpf   Bacteria, UA None seen None seen/Few  CBC with Differential/Platelet  Result Value Ref Range   WBC 4.0 3.4 - 10.8 x10E3/uL   RBC 3.95 3.77 - 5.28 x10E6/uL   Hemoglobin 13.0 11.1 - 15.9 g/dL   Hematocrit 37.3 34.0 - 46.6 %   MCV 94 79 - 97 fL   MCH 32.9 26.6 - 33.0 pg   MCHC 34.9 31.5 - 35.7 g/dL   RDW 10.7 (L) 11.7 - 15.4 %   Platelets 227 150 - 450 x10E3/uL   Neutrophils 56 Not Estab. %   Lymphs 35 Not Estab. %   Monocytes 7 Not Estab. %   Eos 1 Not Estab. %   Basos 1 Not Estab. %   Neutrophils Absolute 2.2 1.4 - 7.0 x10E3/uL   Lymphocytes Absolute 1.4 0.7 - 3.1 x10E3/uL   Monocytes Absolute 0.3 0.1 - 0.9 x10E3/uL   EOS (ABSOLUTE) 0.0 0.0 - 0.4 x10E3/uL    Basophils Absolute 0.0 0.0 - 0.2 x10E3/uL   Immature Granulocytes 0 Not Estab. %   Immature Grans (Abs) 0.0 0.0 - 0.1 x10E3/uL  Comprehensive metabolic panel  Result Value Ref Range   Glucose 80 70 - 99 mg/dL   BUN 11 6 - 20 mg/dL   Creatinine, Ser 0.56 (L) 0.57 - 1.00 mg/dL   eGFR 135 >59 mL/min/1.73   BUN/Creatinine Ratio 20 9 - 23   Sodium 140 134 - 144 mmol/L   Potassium 4.1 3.5 - 5.2 mmol/L   Chloride 104 96 - 106 mmol/L   CO2 23 20 - 29 mmol/L   Calcium 9.8 8.7 - 10.2 mg/dL   Total Protein 7.5 6.0 - 8.5 g/dL   Albumin 4.8 4.0 - 5.0 g/dL   Globulin, Total 2.7 1.5 - 4.5 g/dL   Albumin/Globulin Ratio 1.8 1.2 - 2.2   Bilirubin Total 0.3 0.0 - 1.2 mg/dL   Alkaline Phosphatase 63 42 - 106 IU/L   AST 16 0 - 40 IU/L   ALT 14 0 - 32 IU/L  Lipid panel  Result Value  Ref Range   Cholesterol, Total 158 100 - 169 mg/dL   Triglycerides 61 0 - 89 mg/dL   HDL 60 >39 mg/dL   VLDL Cholesterol Cal 12 5 - 40 mg/dL   LDL Chol Calc (NIH) 86 0 - 109 mg/dL   Chol/HDL Ratio 2.6 0.0 - 4.4 ratio  TSH  Result Value Ref Range   TSH 1.550 0.450 - 4.500 uIU/mL  Urinalysis, Routine w reflex microscopic  Result Value Ref Range   Specific Gravity, UA 1.025 1.005 - 1.030   pH, UA 5.0 5.0 - 7.5   Color, UA Yellow Yellow   Appearance Ur Clear Clear   Leukocytes,UA Negative Negative   Protein,UA Negative Negative/Trace   Glucose, UA Negative Negative   Ketones, UA Negative Negative   RBC, UA 2+ (A) Negative   Bilirubin, UA Negative Negative   Urobilinogen, Ur 0.2 0.2 - 1.0 mg/dL   Nitrite, UA Negative Negative   Microscopic Examination See below:       Assessment & Plan:   Problem List Items Addressed This Visit   None    Follow up plan: No follow-ups on file.

## 2022-07-17 ENCOUNTER — Ambulatory Visit (INDEPENDENT_AMBULATORY_CARE_PROVIDER_SITE_OTHER): Payer: Medicaid Other | Admitting: Nurse Practitioner

## 2022-07-17 ENCOUNTER — Encounter: Payer: Self-pay | Admitting: Nurse Practitioner

## 2022-07-17 VITALS — BP 101/61 | HR 93 | Temp 98.3°F | Wt 100.9 lb

## 2022-07-17 DIAGNOSIS — M791 Myalgia, unspecified site: Secondary | ICD-10-CM | POA: Diagnosis not present

## 2022-07-17 DIAGNOSIS — E559 Vitamin D deficiency, unspecified: Secondary | ICD-10-CM

## 2022-07-18 LAB — RHEUMATOID FACTOR: Rheumatoid fact SerPl-aCnc: <10 IU/mL (ref <14.0)

## 2022-07-19 ENCOUNTER — Encounter: Payer: Self-pay | Admitting: Nurse Practitioner

## 2022-07-20 LAB — COMPREHENSIVE METABOLIC PANEL
ALT: 11 IU/L (ref 0–32)
AST: 17 IU/L (ref 0–40)
Albumin/Globulin Ratio: 1.9 (ref 1.2–2.2)
Albumin: 5.2 g/dL — ABNORMAL HIGH (ref 4.0–5.0)
Alkaline Phosphatase: 68 IU/L (ref 42–106)
BUN/Creatinine Ratio: 30 — ABNORMAL HIGH (ref 9–23)
BUN: 19 mg/dL (ref 6–20)
Bilirubin Total: 0.3 mg/dL (ref 0.0–1.2)
CO2: 22 mmol/L (ref 20–29)
Calcium: 9.6 mg/dL (ref 8.7–10.2)
Chloride: 103 mmol/L (ref 96–106)
Creatinine, Ser: 0.63 mg/dL (ref 0.57–1.00)
Globulin, Total: 2.8 g/dL (ref 1.5–4.5)
Glucose: 88 mg/dL (ref 70–99)
Potassium: 4.4 mmol/L (ref 3.5–5.2)
Sodium: 140 mmol/L (ref 134–144)
Total Protein: 8 g/dL (ref 6.0–8.5)
eGFR: 131 mL/min/{1.73_m2} (ref >59)

## 2022-07-20 LAB — ANA+ENA+DNA/DS+ANTICH+CENTR
ANA Titer 1: NEGATIVE
Anti JO-1: <0.2 AI (ref 0.0–0.9)
Centromere Ab Screen: <0.2 AI (ref 0.0–0.9)
Chromatin Ab SerPl-aCnc: <0.2 AI (ref 0.0–0.9)
ENA RNP Ab: 0.2 AI (ref 0.0–0.9)
ENA SM Ab Ser-aCnc: <0.2 AI (ref 0.0–0.9)
ENA SSA (RO) Ab: <0.2 AI (ref 0.0–0.9)
ENA SSB (LA) Ab: <0.2 AI (ref 0.0–0.9)
Scleroderma (Scl-70) (ENA) Antibody, IgG: <0.2 AI (ref 0.0–0.9)
dsDNA Ab: 1 IU/mL (ref 0–9)

## 2022-07-20 LAB — CBC WITH DIFFERENTIAL/PLATELET
Basophils Absolute: 0 10*3/uL (ref 0.0–0.2)
Basos: 1 %
EOS (ABSOLUTE): 0.1 10*3/uL (ref 0.0–0.4)
Eos: 3 %
Hematocrit: 41.4 % (ref 34.0–46.6)
Hemoglobin: 14 g/dL (ref 11.1–15.9)
Immature Grans (Abs): 0 10*3/uL (ref 0.0–0.1)
Immature Granulocytes: 0 %
Lymphocytes Absolute: 1.7 10*3/uL (ref 0.7–3.1)
Lymphs: 42 %
MCH: 33 pg (ref 26.6–33.0)
MCHC: 33.8 g/dL (ref 31.5–35.7)
MCV: 98 fL — ABNORMAL HIGH (ref 79–97)
Monocytes Absolute: 0.3 10*3/uL (ref 0.1–0.9)
Monocytes: 8 %
Neutrophils Absolute: 1.9 10*3/uL (ref 1.4–7.0)
Neutrophils: 46 %
Platelets: 244 10*3/uL (ref 150–450)
RBC: 4.24 x10E6/uL (ref 3.77–5.28)
RDW: 10.9 % — ABNORMAL LOW (ref 11.7–15.4)
WBC: 4.1 10*3/uL (ref 3.4–10.8)

## 2022-07-20 LAB — CK: Total CK: 32 U/L (ref 32–182)

## 2022-07-20 LAB — EHRLICHIA ANTIBODY PANEL
E. Chaffeensis (HME) IgM Titer: NEGATIVE
E.Chaffeensis (HME) IgG: NEGATIVE
HGE IgG Titer: NEGATIVE
HGE IgM Titer: NEGATIVE

## 2022-07-20 LAB — URIC ACID: Uric Acid: 4.6 mg/dL (ref 2.6–6.2)

## 2022-07-20 LAB — VITAMIN D 25 HYDROXY (VIT D DEFICIENCY, FRACTURES): Vit D, 25-Hydroxy: 28.8 ng/mL — ABNORMAL LOW (ref 30.0–100.0)

## 2022-07-20 LAB — ANA: Anti Nuclear Antibody (ANA): NEGATIVE

## 2022-07-20 NOTE — Progress Notes (Signed)
Hi Rhonda Fox.  Your lab work is unremarkable.  There is nothing to explain your symptoms but the vitamin D deficiency.  Please be consistent with the vitamin D supplement it has already improved from that last time we checked it.  There is no evidence of a rheumatology concern.  I have placed there referral to physical medicine and rehab.

## 2022-07-20 NOTE — Addendum Note (Signed)
Addended by: Jon Billings on: 07/20/2022 08:12 AM   Modules accepted: Orders

## 2022-07-23 ENCOUNTER — Ambulatory Visit: Payer: Medicaid Other | Admitting: Nurse Practitioner

## 2022-08-06 ENCOUNTER — Telehealth: Payer: Self-pay

## 2022-08-06 NOTE — Transitions of Care (Post Inpatient/ED Visit) (Unsigned)
   08/06/2022  Name: Rhonda Fox MRN: [id number] DOB: [birth year]  Today's TOC FU Call Status: Today's TOC FU Call Status:: Unsuccessul Call (1st Attempt) Unsuccessful Call (1st Attempt) Date: 08/06/22  Attempted to reach the patient regarding the most recent Inpatient/ED visit.  Follow Up Plan: Additional outreach attempts will be made to reach the patient to complete the Transitions of Care (Post Inpatient/ED visit) call.   Signature: Yvonna Alanis, [place]

## 2022-08-06 NOTE — Telephone Encounter (Signed)
Pt is calling back stating that she went to the hospital, her arms is weak, neck pain, back pain is a 10, pt states that at the hospital they advised her to go to a Neurologist.    The Neurologist that the pt was referred to is not calling her back to schedule an appt.    Pt states that there is something really wrong with her and she is needing help.   Please call pt back.

## 2022-08-07 NOTE — Telephone Encounter (Signed)
This referral did not go to the right place.  Physical Medicine and Rehab is not physical therapy.  Please send to PMR.

## 2022-08-07 NOTE — Progress Notes (Deleted)
LMP 07/10/2022 (Exact Date)    Subjective:    Patient ID: Rhonda Fox, female    DOB: 19-Dec-2000, 22 y.o.   MRN: [id number]  HPI: Rhonda Fox is a 22 y.o. female  No chief complaint on file.  BODY ACHES Patient states she has been having pain in her thighs- feels like it is burning and on fire.  She is also having pain in her neck- she doesn't have to touch it but when she does touch it the pain is worse.  When she comes home from work the pain is worse.  She isn't able to mop.  The symptoms started in the last year.  Now she feels like she isn't able to lift anything.  She was given naproxyen which didn't help with the pain.  She had tried NSAIDS and tylenol doesn't help.    Relevant past medical, surgical, family and social history reviewed and updated as indicated. Interim medical history since our last visit reviewed. Allergies and medications reviewed and updated.  Review of Systems  Musculoskeletal:  Positive for arthralgias, back pain, myalgias and neck pain.       Thigh pain    Per HPI unless specifically indicated above     Objective:    LMP 07/10/2022 (Exact Date)   Wt Readings from Last 3 Encounters:  07/17/22 100 lb 14.4 oz (45.8 kg) (4 %, Z= -1.72)*  06/12/22 100 lb 1.4 oz (45.4 kg) (4 %, Z= -1.78)*  04/17/22 100 lb 14.4 oz (45.8 kg) (4 %, Z= -1.70)*   * Growth percentiles are based on CDC (Girls, 2-20 Years) data.    Physical Exam Vitals and nursing note reviewed.  Constitutional:      General: She is not in acute distress.    Appearance: Normal appearance. She is normal weight. She is not ill-appearing, toxic-appearing or diaphoretic.  HENT:     Head: Normocephalic.     Right Ear: External ear normal.     Left Ear: External ear normal.     Nose: Nose normal.     Mouth/Throat:     Mouth: Mucous membranes are moist.     Pharynx: Oropharynx is clear.  Eyes:     General:        Right eye: No discharge.        Left eye: No discharge.      Extraocular Movements: Extraocular movements intact.     Conjunctiva/sclera: Conjunctivae normal.     Pupils: Pupils are equal, round, and reactive to light.  Neck:   Cardiovascular:     Rate and Rhythm: Normal rate and regular rhythm.     Heart sounds: No murmur heard. Pulmonary:     Effort: Pulmonary effort is normal. No respiratory distress.     Breath sounds: Normal breath sounds. No wheezing or rales.  Chest:    Musculoskeletal:     Cervical back: Normal range of motion and neck supple. Pain with movement and muscular tenderness present. Normal range of motion.     Right upper leg: Tenderness present.     Left upper leg: Tenderness present.       Legs:  Skin:    General: Skin is warm and dry.     Capillary Refill: Capillary refill takes less than 2 seconds.  Neurological:     General: No focal deficit present.     Mental Status: She is alert and oriented to person, place, and time. Mental status is at baseline.  Psychiatric:  Mood and Affect: Mood normal.        Behavior: Behavior normal.        Thought Content: Thought content normal.        Judgment: Judgment normal.    Results for orders placed or performed in visit on 07/17/22  Rheumatoid factor  Result Value Ref Range   Rhuematoid fact SerPl-aCnc <10.0 <14.0 IU/mL  ANA  Result Value Ref Range   Anti Nuclear Antibody (ANA) Negative Negative  ANA+ENA+DNA/DS+Antich+Centr  Result Value Ref Range   ANA Titer 1 Negative    dsDNA Ab 1 0 - 9 IU/mL   ENA RNP Ab 0.2 0.0 - 0.9 AI   ENA SM Ab Ser-aCnc <0.2 0.0 - 0.9 AI   Scleroderma (Scl-70) (ENA) Antibody, IgG <0.2 0.0 - 0.9 AI   ENA SSA (RO) Ab <0.2 0.0 - 0.9 AI   ENA SSB (LA) Ab <0.2 0.0 - 0.9 AI   Chromatin Ab SerPl-aCnc <0.2 0.0 - 0.9 AI   Anti JO-1 <0.2 0.0 - 0.9 AI   Centromere Ab Screen <0.2 0.0 - 0.9 AI   See below: Comment   CK  Result Value Ref Range   Total CK 32 32 - 182 U/L  Uric acid  Result Value Ref Range   Uric Acid 4.6 2.6 - 6.2 mg/dL   Ehrlichia antibody panel  Result Value Ref Range   E.Chaffeensis (HME) IgG Negative Neg:<1:64   E. Chaffeensis (HME) IgM Titer Negative Neg:<1:20   HGE IgG Titer Negative Neg:<1:64   HGE IgM Titer Negative Neg:<1:20   Result Comment: Comment   Comprehensive metabolic panel  Result Value Ref Range   Glucose 88 70 - 99 mg/dL   BUN 19 6 - 20 mg/dL   Creatinine, Ser 0.63 0.57 - 1.00 mg/dL   eGFR 131 >59 mL/min/1.73   BUN/Creatinine Ratio 30 (H) 9 - 23   Sodium 140 134 - 144 mmol/L   Potassium 4.4 3.5 - 5.2 mmol/L   Chloride 103 96 - 106 mmol/L   CO2 22 20 - 29 mmol/L   Calcium 9.6 8.7 - 10.2 mg/dL   Total Protein 8.0 6.0 - 8.5 g/dL   Albumin 5.2 (H) 4.0 - 5.0 g/dL   Globulin, Total 2.8 1.5 - 4.5 g/dL   Albumin/Globulin Ratio 1.9 1.2 - 2.2   Bilirubin Total 0.3 0.0 - 1.2 mg/dL   Alkaline Phosphatase 68 42 - 106 IU/L   AST 17 0 - 40 IU/L   ALT 11 0 - 32 IU/L  CBC with Differential/Platelet  Result Value Ref Range   WBC 4.1 3.4 - 10.8 x10E3/uL   RBC 4.24 3.77 - 5.28 x10E6/uL   Hemoglobin 14.0 11.1 - 15.9 g/dL   Hematocrit 41.4 34.0 - 46.6 %   MCV 98 (H) 79 - 97 fL   MCH 33.0 26.6 - 33.0 pg   MCHC 33.8 31.5 - 35.7 g/dL   RDW 10.9 (L) 11.7 - 15.4 %   Platelets 244 150 - 450 x10E3/uL   Neutrophils 46 Not Estab. %   Lymphs 42 Not Estab. %   Monocytes 8 Not Estab. %   Eos 3 Not Estab. %   Basos 1 Not Estab. %   Neutrophils Absolute 1.9 1.4 - 7.0 x10E3/uL   Lymphocytes Absolute 1.7 0.7 - 3.1 x10E3/uL   Monocytes Absolute 0.3 0.1 - 0.9 x10E3/uL   EOS (ABSOLUTE) 0.1 0.0 - 0.4 x10E3/uL   Basophils Absolute 0.0 0.0 - 0.2 x10E3/uL   Immature Granulocytes 0 Not  Estab. %   Immature Grans (Abs) 0.0 0.0 - 0.1 x10E3/uL  Vitamin D (25 hydroxy)  Result Value Ref Range   Vit D, 25-Hydroxy 28.8 (L) 30.0 - 100.0 ng/mL      Assessment & Plan:   Problem List Items Addressed This Visit   None    Follow up plan: No follow-ups on file.

## 2022-08-08 ENCOUNTER — Ambulatory Visit: Payer: Medicaid Other | Admitting: Nurse Practitioner

## 2022-08-11 ENCOUNTER — Emergency Department
Admission: EM | Admit: 2022-08-11 | Discharge: 2022-08-11 | Disposition: A | Discharge status: Home / Self Care | Payer: Medicaid Other | Attending: Emergency Medicine | Admitting: Emergency Medicine

## 2022-08-11 ENCOUNTER — Other Ambulatory Visit: Payer: Self-pay

## 2022-08-11 DIAGNOSIS — L739 Follicular disorder, unspecified: Secondary | ICD-10-CM | POA: Diagnosis present

## 2022-08-11 DIAGNOSIS — L03314 Cellulitis of groin: Secondary | ICD-10-CM | POA: Insufficient documentation

## 2022-08-11 MED ORDER — CEPHALEXIN 500 MG PO CAPS: 500.0000 mg | ORAL_CAPSULE | Freq: Four times a day (QID) | ORAL | 0 refills | Status: AC

## 2022-08-11 NOTE — ED Triage Notes (Signed)
C/O abscess to right labia x 3 days.

## 2022-08-11 NOTE — ED Provider Notes (Signed)
[facility] Provider Note  Patient Contact: 3:54 PM (approximate)   History   Abscess   HPI  Rhonda Fox is a 22 y.o. female presents to the emergency department with a region of folliculitis along the mons pubis.  Patient states that she is experiencing this in the past without needing incision and drainage.  No fever or chills at home.  Patient states that she does not exfoliate before shaving.      Physical Exam   Triage Vital Signs: ED Triage Vitals  Enc Vitals Group     BP 08/11/22 1512 104/70     Pulse Rate 08/11/22 1512 82     Resp 08/11/22 1512 16     Temp 08/11/22 1512 98.4 F (36.9 C)     Temp Source 08/11/22 1512 Oral     SpO2 08/11/22 1512 99 %     Weight 08/11/22 1506 100 lb 15.5 oz (45.8 kg)     Height 08/11/22 1506 5' (1.524 m)     Head Circumference --      Peak Flow --      Pain Score 08/11/22 1506 7     Pain Loc --      Pain Edu? --      Excl. in [place]? --     Most recent vital signs: Vitals:   08/11/22 1512  BP: 104/70  Pulse: 82  Resp: 16  Temp: 98.4 F (36.9 C)  SpO2: 99%     General: Alert and in no acute distress. Eyes:  PERRL. EOMI. Head: No acute traumatic findings ENT:      Nose: No congestion/rhinnorhea.      Mouth/Throat: Mucous membranes are moist. Neck: No stridor. No cervical spine tenderness to palpation. Cardiovascular:  Good peripheral perfusion Respiratory: Normal respiratory effort without tachypnea or retractions. Lungs CTAB. Good air entry to the bases with no decreased or absent breath sounds. Gastrointestinal: Bowel sounds 4 quadrants. Soft and nontender to palpation. No guarding or rigidity. No palpable masses. No distention. No CVA tenderness. Musculoskeletal: Full range of motion to all extremities.  Neurologic:  No gross focal neurologic deficits are appreciated.  Skin: Patient has mild cellulitis with folliculitis along the right mons pubis with some mild induration and no  palpable fluctuance.    ED Results / Procedures / Treatments   Labs (all labs ordered are listed, but only abnormal results are displayed) Labs Reviewed - No data to display     PROCEDURES:  Critical Care performed: No  Procedures   MEDICATIONS ORDERED IN ED: Medications - No data to display   IMPRESSION / MDM / [place] / ED COURSE  I reviewed the triage vital signs and the nursing notes.                              Assessment and plan:  Folliculitis 22 year old female presents to the emergency department with folliculitis of the mons pubis with some surrounding cellulitis and mild induration.  No palpable fluctuance.  I do not think incision and drainage is warranted at this time.  Will start patient on Keflex 4 times daily for the next 7 days.  Return precautions were given to return with new or worsening symptoms.  All patient questions were answered.     FINAL CLINICAL IMPRESSION(S) / ED DIAGNOSES   Final diagnoses:  Folliculitis     Rx / DC Orders   ED Discharge  Orders          Ordered    cephALEXin (KEFLEX) 500 MG capsule  4 times daily        08/11/22 1551             Note:  This document was prepared using Dragon voice recognition software and may include unintentional dictation errors.   Vallarie Mare [place], PA-C 08/11/22 1605    Blake Divine, MD 08/11/22 [phone]

## 2022-08-11 NOTE — Discharge Instructions (Addendum)
Gently exfoliate area as explained three times a week. Take Keflex four times daily for the next seven days.

## 2022-08-13 ENCOUNTER — Telehealth: Payer: Self-pay

## 2022-08-13 NOTE — Transitions of Care (Post Inpatient/ED Visit) (Unsigned)
   08/13/2022  Name: Rhonda Fox MRN: [id number] DOB: Apr 19, 2001  Today's TOC FU Call Status: Today's TOC FU Call Status:: Unsuccessul Call (1st Attempt) Unsuccessful Call (1st Attempt) Date: 08/13/22  Attempted to reach the patient regarding the most recent Inpatient/ED visit.  Follow Up Plan: Additional outreach attempts will be made to reach the patient to complete the Transitions of Care (Post Inpatient/ED visit) call.   Signature: Yvonna Alanis, [place]

## 2022-08-15 NOTE — Transitions of Care (Post Inpatient/ED Visit) (Unsigned)
   08/15/2022  Name: Rhonda Fox MRN: [id number] DOB: November 06, 2000  Today's TOC FU Call Status: Today's TOC FU Call Status:: Unsuccessful Call (2nd Attempt) Unsuccessful Call (1st Attempt) Date: 08/13/22 Unsuccessful Call (2nd Attempt) Date: 08/15/22  Attempted to reach the patient regarding the most recent Inpatient/ED visit.  Follow Up Plan: Additional outreach attempts will be made to reach the patient to complete the Transitions of Care (Post Inpatient/ED visit) call.   Signature: Yvonna Alanis, [place]

## 2022-08-16 NOTE — Transitions of Care (Post Inpatient/ED Visit) (Signed)
   08/16/2022  Name: Rhonda Fox MRN: [id number] DOB: 27-Dec-2000  Today's TOC FU Call Status: Today's TOC FU Call Status:: Unsuccessful Call (3rd Attempt) Unsuccessful Call (1st Attempt) Date: 08/13/22 Unsuccessful Call (2nd Attempt) Date: 08/15/22 Unsuccessful Call (3rd Attempt) Date: 08/16/22  Attempted to reach the patient regarding the most recent Inpatient/ED visit.  Follow Up Plan: No further outreach attempts will be made at this time. We have been unable to contact the patient.  Signature : Yvonna Alanis, [place]

## 2022-09-04 ENCOUNTER — Telehealth: Payer: Self-pay | Admitting: Nurse Practitioner

## 2022-09-04 DIAGNOSIS — M791 Myalgia, unspecified site: Secondary | ICD-10-CM

## 2022-09-04 DIAGNOSIS — R5383 Other fatigue: Secondary | ICD-10-CM

## 2022-09-04 DIAGNOSIS — R531 Weakness: Secondary | ICD-10-CM

## 2022-09-04 NOTE — Telephone Encounter (Signed)
Patient's mother came in this morning.  She was given a neurology referral by the ER but mom has not been able to get a hold of [facility] Neurologists.  New referral placed for patient to see [facility] Neu[facility].

## 2022-10-17 ENCOUNTER — Ambulatory Visit: Payer: Medicaid Other

## 2022-10-22 ENCOUNTER — Ambulatory Visit (INDEPENDENT_AMBULATORY_CARE_PROVIDER_SITE_OTHER): Payer: Medicaid Other | Admitting: Nurse Practitioner

## 2022-10-22 VITALS — BP 100/65 | HR 69 | Temp 98.6°F | Wt 103.2 lb

## 2022-10-22 DIAGNOSIS — R5383 Other fatigue: Secondary | ICD-10-CM

## 2022-10-22 DIAGNOSIS — Z23 Encounter for immunization: Secondary | ICD-10-CM | POA: Diagnosis not present

## 2022-10-22 MED ORDER — VITAMIN D3 125 MCG (5000 UT) PO CAPS: 5000.0000 [IU] | ORAL_CAPSULE | Freq: Every day | ORAL | 0 refills | Status: DC

## 2022-10-22 NOTE — Progress Notes (Signed)
BP 100/65   Pulse 69   Temp 98.6 F (37 C) (Oral)   Wt 103 lb 3.2 oz (46.8 kg)   LMP 10/08/2022 (Approximate)   SpO2 98%   BMI 20.15 kg/m    Subjective:    Patient ID: Rhonda Fox, female    DOB: 05/30/2000, 22 y.o.   MRN: [id number]  HPI: Rhonda Fox is a 22 y.o. female  Chief Complaint  Patient presents with   myalgia   BODY ACHES Patient states her symptoms are gone.  She does have trouble with strength and she is sleeping a lot.  She hasn't made appointments with Neurology or PMR.  States now she is experiencing fatigue.  Has not been taking her vitamin D.  Relevant past medical, surgical, family and social history reviewed and updated as indicated. Interim medical history since our last visit reviewed. Allergies and medications reviewed and updated.  Review of Systems  Constitutional:  Positive for fatigue.  Musculoskeletal:  Negative for arthralgias, back pain, myalgias and neck pain.  Neurological:  Positive for weakness.    Per HPI unless specifically indicated above     Objective:    BP 100/65   Pulse 69   Temp 98.6 F (37 C) (Oral)   Wt 103 lb 3.2 oz (46.8 kg)   LMP 10/08/2022 (Approximate)   SpO2 98%   BMI 20.15 kg/m   Wt Readings from Last 3 Encounters:  10/22/22 103 lb 3.2 oz (46.8 kg) (6 %, Z= -1.54)*  08/11/22 100 lb 15.5 oz (45.8 kg) (4 %, Z= -1.72)*  07/17/22 100 lb 14.4 oz (45.8 kg) (4 %, Z= -1.72)*   * Growth percentiles are based on CDC (Girls, 2-20 Years) data.    Physical Exam Vitals and nursing note reviewed.  Constitutional:      General: She is not in acute distress.    Appearance: Normal appearance. She is normal weight. She is not ill-appearing, toxic-appearing or diaphoretic.  HENT:     Head: Normocephalic.     Right Ear: External ear normal.     Left Ear: External ear normal.     Nose: Nose normal.     Mouth/Throat:     Mouth: Mucous membranes are moist.     Pharynx: Oropharynx is clear.  Eyes:     General:         Right eye: No discharge.        Left eye: No discharge.     Extraocular Movements: Extraocular movements intact.     Conjunctiva/sclera: Conjunctivae normal.     Pupils: Pupils are equal, round, and reactive to light.  Cardiovascular:     Rate and Rhythm: Normal rate and regular rhythm.     Heart sounds: No murmur heard. Pulmonary:     Effort: Pulmonary effort is normal. No respiratory distress.     Breath sounds: Normal breath sounds. No wheezing or rales.  Chest:    Musculoskeletal:     Right hand: Normal strength.     Left hand: Normal strength.     Cervical back: Normal range of motion and neck supple. Normal range of motion.  Skin:    General: Skin is warm and dry.     Capillary Refill: Capillary refill takes less than 2 seconds.  Neurological:     General: No focal deficit present.     Mental Status: She is alert and oriented to person, place, and time. Mental status is at baseline.  Psychiatric:  Mood and Affect: Mood normal.        Behavior: Behavior normal.        Thought Content: Thought content normal.        Judgment: Judgment normal.     Results for orders placed or performed in visit on 07/17/22  Rheumatoid factor  Result Value Ref Range   Rheumatoid fact SerPl-aCnc <10.0 <14.0 IU/mL  ANA  Result Value Ref Range   Anti Nuclear Antibody (ANA) Negative Negative  ANA+ENA+DNA/DS+Antich+Centr  Result Value Ref Range   ANA Titer 1 Negative    dsDNA Ab 1 0 - 9 IU/mL   ENA RNP Ab 0.2 0.0 - 0.9 AI   ENA SM Ab Ser-aCnc <0.2 0.0 - 0.9 AI   Scleroderma (Scl-70) (ENA) Antibody, IgG <0.2 0.0 - 0.9 AI   ENA SSA (RO) Ab <0.2 0.0 - 0.9 AI   ENA SSB (LA) Ab <0.2 0.0 - 0.9 AI   Chromatin Ab SerPl-aCnc <0.2 0.0 - 0.9 AI   Anti JO-1 <0.2 0.0 - 0.9 AI   Centromere Ab Screen <0.2 0.0 - 0.9 AI   See below: Comment   CK  Result Value Ref Range   Total CK 32 32 - 182 U/L  Uric acid  Result Value Ref Range   Uric Acid 4.6 2.6 - 6.2 mg/dL  Ehrlichia antibody  panel  Result Value Ref Range   E.Chaffeensis (HME) IgG Negative Neg:<1:64   E. Chaffeensis (HME) IgM Titer Negative Neg:<1:20   HGE IgG Titer Negative Neg:<1:64   HGE IgM Titer Negative Neg:<1:20   Result Comment: Comment   Comprehensive metabolic panel  Result Value Ref Range   Glucose 88 70 - 99 mg/dL   BUN 19 6 - 20 mg/dL   Creatinine, Ser 4.09 0.57 - 1.00 mg/dL   eGFR 811 >91 YN/WGN/5.62   BUN/Creatinine Ratio 30 (H) 9 - 23   Sodium 140 134 - 144 mmol/L   Potassium 4.4 3.5 - 5.2 mmol/L   Chloride 103 96 - 106 mmol/L   CO2 22 20 - 29 mmol/L   Calcium 9.6 8.7 - 10.2 mg/dL   Total Protein 8.0 6.0 - 8.5 g/dL   Albumin 5.2 (H) 4.0 - 5.0 g/dL   Globulin, Total 2.8 1.5 - 4.5 g/dL   Albumin/Globulin Ratio 1.9 1.2 - 2.2   Bilirubin Total 0.3 0.0 - 1.2 mg/dL   Alkaline Phosphatase 68 42 - 106 IU/L   AST 17 0 - 40 IU/L   ALT 11 0 - 32 IU/L  CBC with Differential/Platelet  Result Value Ref Range   WBC 4.1 3.4 - 10.8 x10E3/uL   RBC 4.24 3.77 - 5.28 x10E6/uL   Hemoglobin 14.0 11.1 - 15.9 g/dL   Hematocrit 13.0 86.5 - 46.6 %   MCV 98 (H) 79 - 97 fL   MCH 33.0 26.6 - 33.0 pg   MCHC 33.8 31.5 - 35.7 g/dL   RDW 78.4 (L) 69.6 - 29.5 %   Platelets 244 150 - 450 x10E3/uL   Neutrophils 46 Not Estab. %   Lymphs 42 Not Estab. %   Monocytes 8 Not Estab. %   Eos 3 Not Estab. %   Basos 1 Not Estab. %   Neutrophils Absolute 1.9 1.4 - 7.0 x10E3/uL   Lymphocytes Absolute 1.7 0.7 - 3.1 x10E3/uL   Monocytes Absolute 0.3 0.1 - 0.9 x10E3/uL   EOS (ABSOLUTE) 0.1 0.0 - 0.4 x10E3/uL   Basophils Absolute 0.0 0.0 - 0.2 x10E3/uL   Immature Granulocytes 0  Not Estab. %   Immature Grans (Abs) 0.0 0.0 - 0.1 x10E3/uL  Vitamin D (25 hydroxy)  Result Value Ref Range   Vit D, 25-Hydroxy 28.8 (L) 30.0 - 100.0 ng/mL      Assessment & Plan:   Problem List Items Addressed This Visit       Other   Other fatigue - Primary    Ongoing concern.  Patient is not taking her vitamin D.  Encouraged her to take  it daily.  Will speak with referral coordinator and have Neurology referral reopened.  Recommend she answer when they call to get her scheduled.  Follow up if symptoms not improved.       Other Visit Diagnoses     Need for HPV vaccination            Follow up plan: Return in about 6 months (around 04/23/2023) for Physical and Fasting labs.

## 2022-10-22 NOTE — Assessment & Plan Note (Signed)
Ongoing concern.  Patient is not taking her vitamin D.  Encouraged her to take it daily.  Will speak with referral coordinator and have Neurology referral reopened.  Recommend she answer when they call to get her scheduled.  Follow up if symptoms not improved.

## 2022-10-23 ENCOUNTER — Encounter: Payer: Self-pay | Admitting: Physical Medicine and Rehabilitation

## 2022-10-29 ENCOUNTER — Encounter: Payer: Self-pay | Admitting: Nurse Practitioner

## 2022-10-30 MED ORDER — NAPROXEN 500 MG PO TABS: 500.0000 mg | ORAL_TABLET | Freq: Two times a day (BID) | ORAL | 0 refills | Status: DC | PRN

## 2022-11-06 ENCOUNTER — Emergency Department
Admission: EM | Admit: 2022-11-06 | Discharge: 2022-11-06 | Disposition: A | Discharge status: Home / Self Care | Payer: Medicaid Other | Attending: Emergency Medicine | Admitting: Emergency Medicine

## 2022-11-06 ENCOUNTER — Emergency Department: Payer: Medicaid Other

## 2022-11-06 ENCOUNTER — Other Ambulatory Visit: Payer: Self-pay

## 2022-11-06 ENCOUNTER — Encounter: Payer: Self-pay | Admitting: Emergency Medicine

## 2022-11-06 DIAGNOSIS — R55 Syncope and collapse: Secondary | ICD-10-CM

## 2022-11-06 DIAGNOSIS — R52 Pain, unspecified: Secondary | ICD-10-CM | POA: Diagnosis not present

## 2022-11-06 DIAGNOSIS — R42 Dizziness and giddiness: Secondary | ICD-10-CM | POA: Diagnosis present

## 2022-11-06 DIAGNOSIS — F840 Autistic disorder: Secondary | ICD-10-CM | POA: Insufficient documentation

## 2022-11-06 DIAGNOSIS — M791 Myalgia, unspecified site: Secondary | ICD-10-CM

## 2022-11-06 LAB — URINALYSIS, ROUTINE W REFLEX MICROSCOPIC
Bilirubin Urine: NEGATIVE
Glucose, UA: NEGATIVE mg/dL
Ketones, ur: NEGATIVE mg/dL
Leukocytes,Ua: NEGATIVE
Nitrite: NEGATIVE
Protein, ur: NEGATIVE mg/dL
Specific Gravity, Urine: 1.028 (ref 1.005–1.030)
pH: 5 (ref 5.0–8.0)

## 2022-11-06 LAB — BASIC METABOLIC PANEL
Anion gap: 9 (ref 5–15)
BUN: 18 mg/dL (ref 6–20)
CO2: 22 mmol/L (ref 22–32)
Calcium: 9.2 mg/dL (ref 8.9–10.3)
Chloride: 102 mmol/L (ref 98–111)
Creatinine, Ser: 0.57 mg/dL (ref 0.44–1.00)
GFR, Estimated: >60 mL/min (ref >60)
Glucose, Bld: 97 mg/dL (ref 70–99)
Potassium: 3.5 mmol/L (ref 3.5–5.1)
Sodium: 133 mmol/L — ABNORMAL LOW (ref 135–145)

## 2022-11-06 LAB — CBC
HCT: 39.3 % (ref 36.0–46.0)
Hemoglobin: 13.4 g/dL (ref 12.0–15.0)
MCH: 32.8 pg (ref 26.0–34.0)
MCHC: 34.1 g/dL (ref 30.0–36.0)
MCV: 96.1 fL (ref 80.0–100.0)
Platelets: 250 10*3/uL (ref 150–400)
RBC: 4.09 MIL/uL (ref 3.87–5.11)
RDW: 11.3 % — ABNORMAL LOW (ref 11.5–15.5)
WBC: 11.5 10*3/uL — ABNORMAL HIGH (ref 4.0–10.5)
nRBC: 0 % (ref 0.0–0.2)

## 2022-11-06 LAB — TROPONIN I (HIGH SENSITIVITY): Troponin I (High Sensitivity): <2 ng/L (ref <18)

## 2022-11-06 LAB — POC URINE PREG, ED: Preg Test, Ur: NEGATIVE

## 2022-11-06 NOTE — ED Provider Notes (Signed)
[facility] Provider Note    Event Date/Time   First MD Initiated Contact with Patient 11/06/22 2134     (approximate)  History   Chief Complaint: Dizziness  HPI  Rhonda Fox is a 22 y.o. female with a past medical history of anxiety, autism, presents to the emergency department for near syncope and generalized discomfort.  According to the patient for the past year or so she has been dealing with episodes of diffuse body pains which she describes as pain in all of her muscles throughout her body especially after standing for prolonged amount of time such as at work.  States she will begin experiencing pain/discomfort and often times she will feel dizzy and lightheaded.  Patient states she has been following up with her doctor for the same over the past year and they have not come up with any conclusive diagnosis.  She is seeing a neurologist on July 10.  Patient denies any fever, no urinary symptoms, no chest pain or trouble breathing.  States she is now feeling much better.  Physical Exam   Triage Vital Signs: ED Triage Vitals  Enc Vitals Group     BP 11/06/22 2057 (!) 121/94     Pulse Rate 11/06/22 2057 89     Resp 11/06/22 2057 18     Temp 11/06/22 2057 99 F (37.2 C)     Temp Source 11/06/22 2057 Oral     SpO2 11/06/22 2057 99 %     Weight 11/06/22 2056 103 lb 2.8 oz (46.8 kg)     Height 11/06/22 2056 5' (1.524 m)     Head Circumference --      Peak Flow --      Pain Score 11/06/22 2056 10     Pain Loc --      Pain Edu? --      Excl. in GC? --     Most recent vital signs: Vitals:   11/06/22 2057  BP: (!) 121/94  Pulse: 89  Resp: 18  Temp: 99 F (37.2 C)  SpO2: 99%    General: Awake, no distress.  CV:  Good peripheral perfusion.  Regular rate and rhythm  Resp:  Normal effort.  Equal breath sounds bilaterally.  Abd:  No distention.  Soft, nontender.  No rebound or guarding.  ED Results / Procedures / Treatments   EKG  EKG  viewed and interpreted by myself shows normal sinus rhythm at 81 bpm, narrow QRS, normal axis, normal intervals, nonspecific ST changes.  RADIOLOGY  I have reviewed and interpreted the patient's chest x-ray images.  No consolidation seen on my evaluation. Radiology is read the chest x-ray is negative.   MEDICATIONS ORDERED IN ED: Medications - No data to display   IMPRESSION / MDM / ASSESSMENT AND PLAN / ED COURSE  I reviewed the triage vital signs and the nursing notes.  Patient's presentation is most consistent with acute presentation with potential threat to life or bodily function.  Patient presents to the emergency department for a near syncopal episode as well as generalized bodyaches and muscle pains.  Patient states this has been ongoing for the past year where she will get generalized bodyaches and muscle pains.  She states it seems to be the longer she stands the more pain and bodyaches she gets.  Patient is seeing neurology on July 10.  We will refer also to rheumatology.  Patient's workup in the emergency department is reassuring including normal CBC, reassuring  chemistry, no concerning urine findings negative pregnancy test and a negative troponin.  Reassuring EKG and clear chest x-ray.  Patient agreeable to plan of care.  Discussed with the patient using over-the-counter ibuprofen if needed for symptoms as written on the box.   FINAL CLINICAL IMPRESSION(S) / ED DIAGNOSES   body aches   Note:  This document was prepared using Dragon voice recognition software and may include unintentional dictation errors.   Minna Antis, MD 11/06/22 2213

## 2022-11-06 NOTE — Discharge Instructions (Signed)
Please call the number provided for rheumatology to arrange a follow-up appointment soon as possible.  Please take over-the-counter ibuprofen as needed for discomfort as written on the box.  Please drink plenty of fluids and follow-up with your doctor soon as possible.  Return to the emergency department for any symptom concerning to yourself.

## 2022-11-06 NOTE — ED Triage Notes (Signed)
Pt presents ambulatory to triage via POV with complaints of dizziness with associated generalized body aches x 1 year. Pt states she has been seen by multiple providers but hasn't been able to be seen by a neurologist regarding her sx. No LOC not on thinners. A&Ox4 at this time. Denies CP or SOB.

## 2022-11-07 ENCOUNTER — Encounter: Payer: Self-pay | Admitting: Physical Medicine and Rehabilitation

## 2022-11-07 ENCOUNTER — Encounter
Payer: Medicaid Other | Attending: Physical Medicine and Rehabilitation | Admitting: Physical Medicine and Rehabilitation

## 2022-11-07 VITALS — BP 90/59 | HR 77 | Ht 60.0 in | Wt 103.8 lb

## 2022-11-07 DIAGNOSIS — G4700 Insomnia, unspecified: Secondary | ICD-10-CM | POA: Diagnosis present

## 2022-11-07 DIAGNOSIS — R5383 Other fatigue: Secondary | ICD-10-CM | POA: Diagnosis present

## 2022-11-07 DIAGNOSIS — M7918 Myalgia, other site: Secondary | ICD-10-CM | POA: Diagnosis present

## 2022-11-07 DIAGNOSIS — F339 Major depressive disorder, recurrent, unspecified: Secondary | ICD-10-CM | POA: Insufficient documentation

## 2022-11-07 MED ORDER — AMITRIPTYLINE HCL 25 MG PO TABS: 25.0000 mg | ORAL_TABLET | Freq: Every day | ORAL | 2 refills | Status: AC

## 2022-11-07 NOTE — Progress Notes (Signed)
Subjective:    Patient ID: Rhonda Fox, female    DOB: 2000/10/25, 22 y.o.   MRN: [id number]  HPI HPI  Rhonda Fox is a 22 y.o. year old female  who  has a past medical history of Anxiety, Autism, and Pectus excavatum.   They are presenting to PM&R clinic as a new patient for pain management evaluation. They were referred by Larae Grooms, NP for treatment of myalgia pain.    Per their note: Patient states she has been having pain in her thighs- feels like it is burning and on fire. She is also having pain in her neck- she doesn't have to touch it but when she does touch it the pain is worse. When she comes home from work the pain is worse. She isn't able to mop. The symptoms started in the last year. Now she feels like she isn't able to lift anything. She was given naproxyen which didn't help with the pain. She had tried NSAIDS and tylenol doesn't help.   She states she went to the ER yesterday "because I was hurting so bad"; listed as presyncope. Patient is seeing neurology on July 10.   Source: top three sources bilateral trapezius, bilateral deltoid, and bilateral legs.   Inciting incident:  1) "After I stopped taking naprosyn, things got much worse". Had been on for a few months prior.    Exacerbating factors:  first thing in the morning and last thing at night, bending forward, standing, and activity Remitting factors: Vitamin D supplement, Naprosyn, massage and relaxation Red flag symptoms: No red flags for back pain endorsed in Hx or ROS  Medications tried: Topical medications (never tried) :   Nsaids (mild effect) : Naprosyn; didn't help at the time, but pain got worse after coming off.   Tylenol  (mild effect) :  Opiates  (never tried) :  Gabapentin / Lyrica  (never tried) :  TCAs  (never tried) :  SNRIs  (never tried) :  Other  (unsure of effect) : Was referred to psychiatry in November; went once and never followed up, unsure where she went, was in  [place].    Other treatments: PT/OT  (never tried) :  Accupuncture/chiropractor/massage  (never tried) :  TENs unit (never tried) :  Injections (never tried) :  Surgery (never tried) :  Other  () : Used to be medicated for ADD and anxiety; her mom took her off of meds because they made her crazier.   Goals for pain control: She is a Archivist and works 2 jobs; in a Programmer, systems, works in [employer] and works at [employer] tree. She has no tuition assistance and does not get help from her parents; needs her jobs to go to school.   Has trouble falling asleep; sleeps about 5 hours per night, wakes up three times per night or so, does not feel well rested during the day. States she gets panic attacks if she gets bad sleep.   Migraines daily, especially at nighttime.   Prior UDS results: No results found for: "LABOPIA", "COCAINSCRNUR", "LABBENZ", "AMPHETMU", "THCU", "LABBARB"   Pain Inventory Average Pain 10 Pain Right Now 10 My pain is constant, sharp, and stabbing  In the last 24 hours, has pain interfered with the following? General activity 0 Relation with others 1 Enjoyment of life 0 What TIME of day is your pain at its worst? morning , evening, and night Sleep (in general) Poor  Pain is worse with:  bending and standing Pain improves with: rest Relief from Meds: 2  walk without assistance how many minutes can you walk? 5 ability to climb steps?  no do you drive?  yes  employed # of hrs/week 30 I need assistance with the following:  dressing, household duties, and shopping Do you have any goals in this area?  yes  bladder control problems weakness numbness tremor trouble walking spasms dizziness confusion depression anxiety loss of taste or smell  Any changes since last visit?  no  Any changes since last visit?  no Primary care Larae Grooms NP    Family History  Problem Relation Age of Onset   Alcohol abuse Brother    Suicidality  Paternal Uncle    Social History   Socioeconomic History   Marital status: Significant Other    Spouse name: Not on file   Number of children: 0   Years of education: Not on file   Highest education level: Associate degree: academic program  Occupational History   Not on file  Tobacco Use   Smoking status: Never   Smokeless tobacco: Never  Vaping Use   Vaping Use: Never used  Substance and Sexual Activity   Alcohol use: Yes    Comment: Socially   Drug use: Not Currently    Types: Marijuana   Sexual activity: Yes    Partners: Male    Birth control/protection: None  Other Topics Concern   Not on file  Social History Narrative   Not on file   Social Determinants of Health   Financial Resource Strain: Medium Risk (10/22/2022)   Overall Financial Resource Strain (CARDIA)    Difficulty of Paying Living Expenses: Somewhat hard  Food Insecurity: Food Insecurity Present (10/22/2022)   Hunger Vital Sign    Worried About Running Out of Food in the Last Year: Sometimes true    Ran Out of Food in the Last Year: Sometimes true  Transportation Needs: Unmet Transportation Needs (10/22/2022)   PRAPARE - Transportation    Lack of Transportation (Medical): Yes    Lack of Transportation (Non-Medical): Yes  Physical Activity: Unknown (10/22/2022)   Exercise Vital Sign    Days of Exercise per Week: 0 days    Minutes of Exercise per Session: Not on file  Stress: Stress Concern Present (10/22/2022)   [employer] of Occupational Health - Occupational Stress Questionnaire    Feeling of Stress : Very much  Social Connections: Unknown (10/22/2022)   Social Connection and Isolation Panel [NHANES]    Frequency of Communication with Friends and Family: More than three times a week    Frequency of Social Gatherings with Friends and Family: Never    Attends Religious Services: 1 to 4 times per year    Active Member of [employer] or Organizations: No    Attends Banker Meetings: Not on  file    Marital Status: Patient declined   No past surgical history on file. Past Medical History:  Diagnosis Date   Anxiety    Autism    Pectus excavatum    BP (!) 90/59   Pulse 77   Ht 5' (1.524 m)   Wt 103 lb 12.8 oz (47.1 kg)   LMP 10/08/2022 (Approximate)   SpO2 98%   BMI 20.27 kg/m   Opioid Risk Score:   Fall Risk Score:  `1  Depression screen PHQ 2/9     11/07/2022    2:25 PM 10/22/2022    1:19 PM 07/17/2022  8:38 AM 06/06/2022    4:03 PM 04/17/2022    3:00 PM 03/16/2022    9:53 AM 09/04/2021    3:04 PM  Depression screen PHQ 2/9  Decreased Interest 3 3 3   0 3 0  Down, Depressed, Hopeless 3 2 3  3 3  0  PHQ - 2 Score 6 5 6  3 6  0  Altered sleeping 3 3 3  3 3    Tired, decreased energy 3 3 3  3 3    Change in appetite 3 3 2  1 3    Feeling bad or failure about yourself  3 3 3  1 3    Trouble concentrating 2 3 3  2 1    Moving slowly or fidgety/restless 3 3 3   0 2   Suicidal thoughts 2 0 1  1 1    PHQ-9 Score 25 23 24  14 22    Difficult doing work/chores Extremely dIfficult Extremely dIfficult Extremely dIfficult  Very difficult Not difficult at all      Information is confidential and restricted. Go to Review Flowsheets to unlock data.     Review of Systems  Constitutional:  Positive for appetite change, chills, diaphoresis, fatigue and fever.  HENT: Negative.    Eyes: Negative.   Respiratory:  Positive for apnea, cough and shortness of breath.   Cardiovascular: Negative.   Gastrointestinal:  Positive for abdominal pain, constipation and nausea.  Endocrine: Negative.   Genitourinary:  Positive for difficulty urinating.  Musculoskeletal:  Positive for gait problem and myalgias.       Spasms  Skin:  Positive for rash.  Allergic/Immunologic: Negative.   Neurological:  Positive for dizziness, tremors, weakness and numbness.  Hematological: Negative.   Psychiatric/Behavioral:  Positive for dysphoric mood. The patient is nervous/anxious.   All other systems  reviewed and are negative.      Objective:   Physical Exam   PE: Constitution: Appropriate appearance for age. No apparent distress  Resp: No respiratory distress. No accessory muscle usage. on RA and CTAB Cardio: Well perfused appearance. No peripheral edema. Abdomen: Nondistended. Nontender.   Psych: Appropriate mood and affect. Neuro: AAOx4. Poor historian regarding medical Hx.   Neurologic Exam:   DTRs: Reflexes were 2+ in bilateral achilles, patella, biceps, BR and triceps. Babinsky: flexor responses b/l.   Hoffmans: negative b/l Sensory exam: revealed normal sensation in all dermatomal regions in bilateral upper extremities and bilateral lower extremities Motor exam: strength 5/5 throughout bilateral upper extremities and bilateral lower extremities; some give-way weakness Coordination: Fine motor coordination was normal.   Gait: normal    MSK evaluation: No double jointed ness or hypermobility.  +TTP throughout shoulders, spine, trunk, and legs  WIDESPREAD PAIN INDEX: Five body regions. Score 0-19 Right upper region: jaw, shoulder girdle, arm, lower arm Left upper region: jaw, shoulder girdle, arm, lower arm Axial region: neck, upper back, lower back, chest, abdomen Right lower region: hip/buttock, leg, lower leg Left lower region: hip/buttock, leg, lower leg  SYMPTOM SEVERITY SCORE Symptom Severity Scale plus Symptoms. Score 0-12 Symptom Severity Scale (0- 9) Fatigue: Score 0=none, 1=mild, 2=moderate, 3=severe Waking unrefreshed: Score 0=none, 1=mild, 2=moderate, 3=severe Cognitive symptoms: Score 0=none, 1=mild, 2=moderate, 3=severe Symptoms in the last six months (0-3) Headaches (0-1), Lower abdominal pain (0-1), Depression (0-1)   WPI Score : 15 /19   SSS Score :  10 /12  Fibromyalgia Score (FS) =WPI +SSS 15 /31      Assessment & Plan:   FELESHA ROBBS is a 22 y.o.  year old female  who  has a past medical history of Anxiety, Autism, and Pectus  excavatum.   They are presenting to PM&R clinic as a new patient for pain management evaluation. They were referred by Larae Grooms, NP for treatment of myalgia pain.   Myofascial pain syndrome Today, we discussed tentative diagnosis of fibromyalgia, however we will hold off for official diagnosis until neurology evaluation next month.    Will have patient follow-up in 1 to 2 months, at that time we will perform trigger point injections and follow-up on medications as below.  Once pain is better under control, we will talk about getting you into aqua therapy  We discussed energy conservation and not overdoing it, especially in the context of working 2 jobs.  Would prioritize schoolwork, and look into tuition assistance to help prevent overexertion by working 2 jobs while going to school.  Other fatigue Depression, recurrent (HCC) Insomnia, unspecified type For pain, difficulty sleeping, and mood control, start Elavil 25 mg at nighttime.  If any side effects to this medication, call or message me through MyChart and let me know.  I do want to hear from you in 1 to 2 weeks to know how this medication is going.  For adjunctive pain control, you can use Tylenol up to 3000 mg daily over-the-counter, and Voltaren gel to your neck and shoulders up to 4 times daily.  For sleep, I want you to pick a time to lay down every night, ideally between 8 and 10 PM.    Starting 1 hour before you want to go to sleep, turn off all television screens, phone screens, tablets, and computers.    Keep the lights low and perform only low stimulation activities, such as reading.    Only use your bedroom for sleep and sex.    You may also take 3 to 5 mg of over-the-counter melatonin approximately 1 hour before bedtime.  Other orders -     Amitriptyline HCl; Take 1 tablet (25 mg total) by mouth at bedtime.  Dispense: 30 tablet; Refill: 2

## 2022-11-07 NOTE — Patient Instructions (Signed)
  Myofascial pain syndrome Today, we discussed tentative diagnosis of fibromyalgia, however we will hold off for official diagnosis until neurology evaluation next month.    Will have patient follow-up in 1 to 2 months, at that time we will perform trigger point injections and follow-up on medications as below.  Once pain is better under control, we will talk about getting you into aqua therapy  We discussed energy conservation and not overdoing it, especially in the context of working 2 jobs.  Would prioritize schoolwork, and look into tuition assistance to help prevent overexertion by working 2 jobs while going to school.  Other fatigue Depression, recurrent (HCC) Insomnia, unspecified type For pain, difficulty sleeping, and mood control, start Elavil 25 mg at nighttime.  If any side effects to this medication, call or message me through MyChart and let me know.  I do want to hear from you in 1 to 2 weeks to know how this medication is going.  For adjunctive pain control, you can use Tylenol up to 3000 mg daily over-the-counter, and Voltaren gel to your neck and shoulders up to 4 times daily.  For sleep, I want you to pick a time to lay down every night, ideally between 8 and 10 PM.    Starting 1 hour before you want to go to sleep, turn off all television screens, phone screens, tablets, and computers.    Keep the lights low and perform only low stimulation activities, such as reading.    Only use your bedroom for sleep and sex.    You may also take 3 to 5 mg of over-the-counter melatonin approximately 1 hour before bedtime.  Other orders -     Amitriptyline HCl; Take 1 tablet (25 mg total) by mouth at bedtime.  Dispense: 30 tablet; Refill: 2

## 2022-11-08 ENCOUNTER — Encounter: Payer: Self-pay | Admitting: Family Medicine

## 2022-11-08 ENCOUNTER — Ambulatory Visit (INDEPENDENT_AMBULATORY_CARE_PROVIDER_SITE_OTHER): Payer: Medicaid Other | Admitting: Family Medicine

## 2022-11-08 ENCOUNTER — Ambulatory Visit: Payer: Self-pay

## 2022-11-08 VITALS — BP 116/72 | HR 68 | Temp 100.3°F | Wt 101.3 lb

## 2022-11-08 DIAGNOSIS — J029 Acute pharyngitis, unspecified: Secondary | ICD-10-CM | POA: Diagnosis not present

## 2022-11-08 MED ORDER — LIDOCAINE VISCOUS HCL 2 % MT SOLN: 15.0000 mL | OROMUCOSAL | 0 refills | Status: DC | PRN

## 2022-11-08 MED ORDER — NAPROXEN 500 MG PO TABS: 500.0000 mg | ORAL_TABLET | Freq: Two times a day (BID) | ORAL | 0 refills | Status: DC | PRN

## 2022-11-08 NOTE — Telephone Encounter (Signed)
    Chief Complaint: Sore throat, chills, dizziness, body aches. Symptoms: Above Frequency: 2 days ago Pertinent Negatives: Patient denies  Disposition: [] ED /[] Urgent Care (no appt availability in office) / [x] Appointment(In office/virtual)/ []  [place]/ [] Home Care/ [] Refused Recommended Disposition /[] [place] [facility]/ []  Follow-up with PCP Additional Notes:   Reason for Disposition  SEVERE (e.g., excruciating) throat pain  Answer Assessment - Initial Assessment Questions 1. ONSET: "When did the throat start hurting?" (Hours or days ago)      2 days ago 2. SEVERITY: "How bad is the sore throat?" (Scale 1-10; mild, moderate or severe)   - MILD (1-3):  Doesn't interfere with eating or normal activities.   - MODERATE (4-7): Interferes with eating some solids and normal activities.   - SEVERE (8-10):  Excruciating pain, interferes with most normal activities.   - SEVERE WITH DYSPHAGIA (10): Can't swallow liquids, drooling.     Moderate 3. STREP EXPOSURE: "Has there been any exposure to strep within the past week?" If Yes, ask: "What type of contact occurred?"      No 4.  VIRAL SYMPTOMS: "Are there any symptoms of a cold, such as a runny nose, cough, hoarse voice or red eyes?"      Dizzy 5. FEVER: "Do you have a fever?" If Yes, ask: "What is your temperature, how was it measured, and when did it start?"     Chills 6. PUS ON THE TONSILS: "Is there pus on the tonsils in the back of your throat?"     No 7. OTHER SYMPTOMS: "Do you have any other symptoms?" (e.g., difficulty breathing, headache, rash)     No 8. PREGNANCY: "Is there any chance you are pregnant?" "When was your last menstrual period?"     No  Protocols used: Sore Throat-A-AH

## 2022-11-08 NOTE — Progress Notes (Signed)
BP 116/72   Pulse 68   Temp 100.3 F (37.9 C) (Oral)   Wt 101 lb 4.8 oz (45.9 kg)   LMP 11/07/2022 (Approximate)   SpO2 100%   BMI 19.78 kg/m    Subjective:    Patient ID: Rhonda Fox, female    DOB: 2000-05-28, 22 y.o.   MRN: [id number]  HPI: Rhonda Fox is a 22 y.o. female  Chief Complaint  Patient presents with   Sore Throat    Pt states that she has been feeling back for a week now   UPPER RESPIRATORY TRACT INFECTION Duration: 2 days Worst symptom: sore throat, body aches Fever: yes 102.3 Cough: yes Shortness of breath: yes Wheezing: no Chest pain: yes Chest tightness: yes Chest congestion: no Nasal congestion: yes Runny nose: yes Post nasal drip: no Sneezing: no Sore throat: yes Swollen glands: no Sinus pressure: no Headache: no Face pain: no Toothache: no Ear pain: no  Ear pressure: no  Eyes red/itching:no Eye drainage/crusting: no  Vomiting: no Rash: no Fatigue: yes Sick contacts: no Strep contacts: no  Context: worse Recurrent sinusitis: no Relief with OTC cold/cough medications: no  Treatments attempted: none   Relevant past medical, surgical, family and social history reviewed and updated as indicated. Interim medical history since our last visit reviewed. Allergies and medications reviewed and updated.  Review of Systems  Constitutional:  Positive for chills, diaphoresis, fatigue and fever. Negative for activity change, appetite change and unexpected weight change.  HENT:  Positive for congestion, rhinorrhea and sore throat. Negative for dental problem, drooling, ear discharge, ear pain, facial swelling, hearing loss, mouth sores, nosebleeds, postnasal drip, sinus pressure, sinus pain, sneezing, tinnitus, trouble swallowing and voice change.   Respiratory:  Positive for cough, chest tightness and shortness of breath. Negative for apnea, choking, wheezing and stridor.   Cardiovascular: Negative.  Negative for chest pain,  palpitations and leg swelling.  Gastrointestinal: Negative.   Skin: Negative.   Neurological: Negative.   Psychiatric/Behavioral: Negative.      Per HPI unless specifically indicated above     Objective:    BP 116/72   Pulse 68   Temp 100.3 F (37.9 C) (Oral)   Wt 101 lb 4.8 oz (45.9 kg)   LMP 11/07/2022 (Approximate)   SpO2 100%   BMI 19.78 kg/m   Wt Readings from Last 3 Encounters:  11/08/22 101 lb 4.8 oz (45.9 kg) (4 %, Z= -1.70)*  11/07/22 103 lb 12.8 oz (47.1 kg) (7 %, Z= -1.49)*  11/06/22 103 lb 2.8 oz (46.8 kg) (6 %, Z= -1.54)*   * Growth percentiles are based on CDC (Girls, 2-20 Years) data.    Physical Exam Vitals and nursing note reviewed.  Constitutional:      General: She is not in acute distress.    Appearance: Normal appearance. She is well-developed and normal weight. She is not ill-appearing, toxic-appearing or diaphoretic.  HENT:     Head: Normocephalic and atraumatic.     Right Ear: Tympanic membrane, ear canal and external ear normal.     Left Ear: Tympanic membrane, ear canal and external ear normal.     Nose: Congestion and rhinorrhea present.     Mouth/Throat:     Mouth: Mucous membranes are moist.     Pharynx: Oropharynx is clear. Posterior oropharyngeal erythema present.  Eyes:     General: No scleral icterus.       Right eye: No discharge.  Left eye: No discharge.     Extraocular Movements: Extraocular movements intact.     Conjunctiva/sclera: Conjunctivae normal.     Pupils: Pupils are equal, round, and reactive to light.  Cardiovascular:     Rate and Rhythm: Normal rate and regular rhythm.     Pulses: Normal pulses.     Heart sounds: Normal heart sounds. No murmur heard.    No friction rub. No gallop.  Pulmonary:     Effort: Pulmonary effort is normal. No respiratory distress.     Breath sounds: Normal breath sounds. No stridor. No wheezing, rhonchi or rales.  Chest:     Chest wall: No tenderness.  Musculoskeletal:         General: Normal range of motion.     Cervical back: Normal range of motion and neck supple.  Skin:    General: Skin is warm and dry.     Capillary Refill: Capillary refill takes less than 2 seconds.     Coloration: Skin is not jaundiced or pale.     Findings: No bruising, erythema, lesion or rash.  Neurological:     General: No focal deficit present.     Mental Status: She is alert and oriented to person, place, and time. Mental status is at baseline.  Psychiatric:        Mood and Affect: Mood normal.        Behavior: Behavior normal.        Thought Content: Thought content normal.        Judgment: Judgment normal.     Results for orders placed or performed during the hospital encounter of 11/06/22  Basic metabolic panel  Result Value Ref Range   Sodium 133 (L) 135 - 145 mmol/L   Potassium 3.5 3.5 - 5.1 mmol/L   Chloride 102 98 - 111 mmol/L   CO2 22 22 - 32 mmol/L   Glucose, Bld 97 70 - 99 mg/dL   BUN 18 6 - 20 mg/dL   Creatinine, Ser 0.34 0.44 - 1.00 mg/dL   Calcium 9.2 8.9 - 74.2 mg/dL   GFR, Estimated >59 >56 mL/min   Anion gap 9 5 - 15  CBC  Result Value Ref Range   WBC 11.5 (H) 4.0 - 10.5 K/uL   RBC 4.09 3.87 - 5.11 MIL/uL   Hemoglobin 13.4 12.0 - 15.0 g/dL   HCT 38.7 56.4 - 33.2 %   MCV 96.1 80.0 - 100.0 fL   MCH 32.8 26.0 - 34.0 pg   MCHC 34.1 30.0 - 36.0 g/dL   RDW 95.1 (L) 88.4 - 16.6 %   Platelets 250 150 - 400 K/uL   nRBC 0.0 0.0 - 0.2 %  Urinalysis, Routine w reflex microscopic -Urine, Clean Catch  Result Value Ref Range   Color, Urine YELLOW (A) YELLOW   APPearance HAZY (A) CLEAR   Specific Gravity, Urine 1.028 1.005 - 1.030   pH 5.0 5.0 - 8.0   Glucose, UA NEGATIVE NEGATIVE mg/dL   Hgb urine dipstick SMALL (A) NEGATIVE   Bilirubin Urine NEGATIVE NEGATIVE   Ketones, ur NEGATIVE NEGATIVE mg/dL   Protein, ur NEGATIVE NEGATIVE mg/dL   Nitrite NEGATIVE NEGATIVE   Leukocytes,Ua NEGATIVE NEGATIVE   RBC / HPF 0-5 0 - 5 RBC/hpf   WBC, UA 0-5 0 - 5 WBC/hpf    Bacteria, UA FEW (A) NONE SEEN   Squamous Epithelial / HPF 6-10 0 - 5 /HPF   Mucus PRESENT   POC urine preg, ED  Result Value Ref Range   Preg Test, Ur NEGATIVE NEGATIVE  Troponin I (High Sensitivity)  Result Value Ref Range   Troponin I (High Sensitivity) <2 <18 ng/L      Assessment & Plan:   Problem List Items Addressed This Visit   None Visit Diagnoses     Sore throat    -  Primary   Strep negative. Await culture and covid. Will treat with lidocaine and naproxen. Call with any concerns or if not getting better.   Relevant Orders   Rapid Strep Screen ([facility] ONLY)   Novel Coronavirus, NAA ([facility])        Follow up plan: Return if symptoms worsen or fail to improve.

## 2022-11-10 LAB — NOVEL CORONAVIRUS, NAA: SARS-CoV-2, NAA: DETECTED — AB

## 2022-11-11 LAB — RAPID STREP SCREEN (MED CTR MEBANE ONLY): Strep Gp A Ag, IA W/Reflex: NEGATIVE

## 2022-11-11 LAB — CULTURE, GROUP A STREP: Strep A Culture: NEGATIVE

## 2022-11-21 ENCOUNTER — Encounter: Payer: Medicaid Other | Admitting: Physical Medicine and Rehabilitation

## 2022-12-17 ENCOUNTER — Encounter: Payer: MEDICAID | Admitting: Physical Medicine and Rehabilitation

## 2023-01-30 ENCOUNTER — Ambulatory Visit
Admission: EM | Admit: 2023-01-30 | Discharge: 2023-01-30 | Disposition: A | Discharge status: Home / Self Care | Payer: MEDICAID | Attending: Physician Assistant | Admitting: Physician Assistant

## 2023-01-30 DIAGNOSIS — N9089 Other specified noninflammatory disorders of vulva and perineum: Secondary | ICD-10-CM | POA: Diagnosis present

## 2023-01-30 LAB — WET PREP, GENITAL
Clue Cells Wet Prep HPF POC: NONE SEEN
Sperm: NONE SEEN
Trich, Wet Prep: NONE SEEN
WBC, Wet Prep HPF POC: <10 — AB (ref <10)
Yeast Wet Prep HPF POC: NONE SEEN

## 2023-01-30 LAB — URINALYSIS, W/ REFLEX TO CULTURE (INFECTION SUSPECTED)
Glucose, UA: NEGATIVE mg/dL
Hgb urine dipstick: NEGATIVE
Ketones, ur: 15 mg/dL — AB
Leukocytes,Ua: NEGATIVE
Nitrite: NEGATIVE
Protein, ur: NEGATIVE mg/dL
Specific Gravity, Urine: >1.03 — ABNORMAL HIGH (ref 1.005–1.030)
pH: 5.5 (ref 5.0–8.0)

## 2023-01-30 LAB — PREGNANCY, URINE: Preg Test, Ur: NEGATIVE

## 2023-01-30 MED ORDER — IBUPROFEN 600 MG PO TABS: 600.0000 mg | ORAL_TABLET | Freq: Four times a day (QID) | ORAL | 0 refills | Status: DC | PRN

## 2023-01-30 NOTE — ED Triage Notes (Signed)
Sx started this morning.  Vaginal area is swollen,itchy,looks like something coming out.   Had sex last night. Didn't hurt.

## 2023-01-30 NOTE — Discharge Instructions (Addendum)
-  You are negative for BV and yeast.  We did not test you for STIs because you were not concerned. - You do not have a UTI. - You do not have prolapse we have an extremely swollen vulva/labia. - Ice the area every couple of hours over the next few days and start taking the anti-inflammatory medicine I sent to the pharmacy.  You can take Tylenol to if you need to. - Follow-up with PCP if this is not improving over the next week or if you begin to experience any new or worsening symptoms.

## 2023-01-30 NOTE — ED Provider Notes (Signed)
[facility]    CSN: [id number] Arrival date & time: 01/30/23  1805      History   Chief Complaint Chief Complaint  Patient presents with   vaginal problem    HPI Rhonda Fox is a 22 y.o. female presenting for vulvar pain and swelling as well as itchiness that began this morning.  She reports having sexual intercourse last night and this morning.  Had some discomfort with sex this morning so she stopped having sex with a person.  She denies any vaginal discharge or odor.  No concern for STIs.  Is not sexually active the same person for the past 4 to 5 years.  She is concerned about potential prolapse although she has no history of this.  No history of childbirth or any sort of GU surgery.  Patient has never had a pelvic exam or Pap smear before.  She has iced the area and taking Tylenol without relief.  HPI  Past Medical History:  Diagnosis Date   Anxiety    Autism    Pectus excavatum     Patient Active Problem List   Diagnosis Date Noted   Myofascial pain syndrome 11/07/2022   Insomnia 11/07/2022   Episodic mood disorder (HCC) 06/06/2022   Attention and concentration deficit 06/06/2022   Depression, recurrent (HCC) 03/16/2022   Other fatigue 03/16/2022    History reviewed. No pertinent surgical history.  OB History     Gravida  0   Para  0   Term  0   Preterm  0   AB  0   Living  0      SAB  0   IAB  0   Ectopic  0   Multiple  0   Live Births  0            Home Medications    Prior to Admission medications   Medication Sig Start Date End Date Taking? Authorizing Provider  ibuprofen (ADVIL) 600 MG tablet Take 1 tablet (600 mg total) by mouth every 6 (six) hours as needed for moderate pain. 01/30/23  Yes Shirlee Latch, PA-C  amitriptyline (ELAVIL) 25 MG tablet Take 1 tablet (25 mg total) by mouth at bedtime. Patient not taking: Reported on 11/08/2022 11/07/22   Angelina Sheriff, DO  Cholecalciferol (VITAMIN D3) 125 MCG  (5000 UT) CAPS Take 1 capsule (5,000 Units total) by mouth daily. Patient not taking: Reported on 11/08/2022 10/22/22   Larae Grooms, NP  lidocaine (XYLOCAINE) 2 % solution Use as directed 15 mLs in the mouth or throat as needed for mouth pain. 11/08/22   Dorcas Carrow, DO    Family History Family History  Problem Relation Age of Onset   Alcohol abuse Brother    Suicidality Paternal Uncle     Social History Social History   Tobacco Use   Smoking status: Never   Smokeless tobacco: Never  Vaping Use   Vaping status: Never Used  Substance Use Topics   Alcohol use: Yes    Comment: Socially   Drug use: Not Currently    Types: Marijuana     Allergies   Kiwi extract   Review of Systems Review of Systems  Constitutional:  Negative for chills, fatigue and fever.  Gastrointestinal:  Negative for abdominal pain, diarrhea, nausea and vomiting.  Genitourinary:  Positive for vaginal pain. Negative for decreased urine volume, dysuria, flank pain, frequency, hematuria, pelvic pain, urgency, vaginal bleeding and vaginal discharge.  Musculoskeletal:  Negative for back pain.  Skin:  Negative for rash.     Physical Exam Triage Vital Signs ED Triage Vitals  Encounter Vitals Group     BP      Systolic BP Percentile      Diastolic BP Percentile      Pulse      Resp      Temp      Temp src      SpO2      Weight      Height      Head Circumference      Peak Flow      Pain Score      Pain Loc      Pain Education      Exclude from Growth Chart    No data found.  Updated Vital Signs BP 102/65 (BP Location: Left Arm)   Pulse 78   Temp 98.2 F (36.8 C) (Oral)   Resp 18   Wt 100 lb (45.4 kg)   SpO2 98%   BMI 19.53 kg/m    Physical Exam Vitals and nursing note reviewed.  Constitutional:      General: She is not in acute distress.    Appearance: Normal appearance. She is not ill-appearing or toxic-appearing.  HENT:     Head: Normocephalic and atraumatic.  Eyes:      General: No scleral icterus.       Right eye: No discharge.        Left eye: No discharge.     Conjunctiva/sclera: Conjunctivae normal.  Cardiovascular:     Rate and Rhythm: Normal rate and regular rhythm.  Pulmonary:     Effort: Pulmonary effort is normal. No respiratory distress.  Abdominal:     Palpations: Abdomen is soft.     Tenderness: There is no abdominal tenderness.  Genitourinary:    Comments: There is significant swelling of the labia with mild erythema.  Scant whitish vaginal discharge.  Has discomfort with speculum exam.  No evidence of prolapse.  No genital lesions. Musculoskeletal:     Cervical back: Neck supple.  Skin:    General: Skin is dry.  Neurological:     General: No focal deficit present.     Mental Status: She is alert. Mental status is at baseline.     Motor: No weakness.     Gait: Gait normal.  Psychiatric:        Mood and Affect: Mood normal.        Behavior: Behavior normal.        Thought Content: Thought content normal.      UC Treatments / Results  Labs (all labs ordered are listed, but only abnormal results are displayed) Labs Reviewed  WET PREP, GENITAL - Abnormal; Notable for the following components:      Result Value   WBC, Wet Prep HPF POC <10 (*)    All other components within normal limits  URINALYSIS, W/ REFLEX TO CULTURE (INFECTION SUSPECTED) - Abnormal; Notable for the following components:   Specific Gravity, Urine >1.030 (*)    Bilirubin Urine SMALL (*)    Ketones, ur 15 (*)    Bacteria, UA FEW (*)    All other components within normal limits  PREGNANCY, URINE    EKG   Radiology No results found.  Procedures Procedures (including critical care time)  Medications Ordered in UC Medications - No data to display  Initial Impression / Assessment and Plan / UC Course  I have  reviewed the triage vital signs and the nursing notes.  Pertinent labs & imaging results that were available during my care of the patient  were reviewed by me and considered in my medical decision making (see chart for details).   22 year old female presents for swelling of vulva and pain.  Had rough sexual intercourse last night and this morning.  Has applied ice and taken Tylenol.  On exam she does have significant swelling of the labia with mild erythema and scant whitish vaginal discharge.  Wet prep is negative.  Suspect the swelling is related to injury from rough sexual intercourse.  Discussed continuing cryotherapy but more frequently and starting anti-inflammatory medicine.  Sent ibuprofen to pharmacy.  Discussed return and ER precautions.   Final Clinical Impressions(s) / UC Diagnoses   Final diagnoses:  Swelling of vulva     Discharge Instructions      -You are negative for BV and yeast.  We did not test you for STIs because you were not concerned. - You do not have a UTI. - You do not have prolapse we have an extremely swollen vulva/labia. - Ice the area every couple of hours over the next few days and start taking the anti-inflammatory medicine I sent to the pharmacy.  You can take Tylenol to if you need to. - Follow-up with PCP if this is not improving over the next week or if you begin to experience any new or worsening symptoms.     ED Prescriptions     Medication Sig Dispense Auth. Provider   ibuprofen (ADVIL) 600 MG tablet Take 1 tablet (600 mg total) by mouth every 6 (six) hours as needed for moderate pain. 30 tablet Gareth Morgan      PDMP not reviewed this encounter.   Shirlee Latch, PA-C 01/30/23 1958

## 2023-03-11 ENCOUNTER — Ambulatory Visit: Payer: Self-pay | Admitting: *Deleted

## 2023-03-11 NOTE — Telephone Encounter (Signed)
Summary: Urgent care advice, symptomatic   [phone] pt is seeking urgent care advice     Reason for Disposition  [1] MODERATE pain (e.g., interferes with normal activities) AND [2] present > 3 days  Answer Assessment - Initial Assessment Questions 1. ONSET: "When did the muscle aches or body pains start?"      2 days ago 2. LOCATION: "What part of your body is hurting?" (e.g., entire body, arms, legs)      All over- worse in legs, back and shoulders 3. SEVERITY: "How bad is the pain?" (Scale 1-10; or mild, moderate, severe)   - MILD (1-3): doesn't interfere with normal activities    - MODERATE (4-7): interferes with normal activities or awakens from sleep    - SEVERE (8-10):  excruciating pain, unable to do any normal activities      Moderate- 8/10 4. CAUSE: "What do you think is causing the pains?"     Not sure- possible flu/COVID 5. FEVER: "Have you been having fever?"     Hot today- does not have thermometer  6. OTHER SYMPTOMS: "Do you have any other symptoms?" (e.g., chest pain, weakness, rash, cold or flu symptoms, weight loss)     Headache, sore throat, congestion, hot  8. TRAVEL: "Have you traveled out of the country in the last month?" (e.g., travel history, exposures)     Possible COVID exposure  Protocols used: Muscle Aches and Body Pain-A-AH

## 2023-03-11 NOTE — Telephone Encounter (Signed)
  Chief Complaint: body aches, sore throat,feverish, headache Symptoms: see above Frequency: symptoms started 2 days ago  Disposition: [] ED /[] Urgent Care (no appt availability in office) / [x] Appointment(In office/virtual)/ []  [place]/ [] Home Care/ [] Refused Recommended Disposition /[] [place] [facility]/ []  Follow-up with PCP Additional Notes: Patient has been scheduled for appointment with her provider in am- care advised given for her symptoms- if she gets worse- UC-other wise keep appointment in am

## 2023-03-12 ENCOUNTER — Ambulatory Visit: Payer: Medicaid Other | Admitting: Nurse Practitioner

## 2023-03-12 NOTE — Progress Notes (Deleted)
   There were no vitals taken for this visit.   Subjective:    Patient ID: Rhonda Fox, female    DOB: 09-21-2000, 22 y.o.   MRN: [id number]  HPI: Rhonda Fox is a 22 y.o. female  No chief complaint on file.  UPPER RESPIRATORY TRACT INFECTION Worst symptom: Fever: {Blank single:19197::"yes","no"} Cough: {Blank single:19197::"yes","no"} Shortness of breath: {Blank single:19197::"yes","no"} Wheezing: {Blank single:19197::"yes","no"} Chest pain: {Blank single:19197::"yes","no","yes, with cough"} Chest tightness: {Blank single:19197::"yes","no"} Chest congestion: {Blank single:19197::"yes","no"} Nasal congestion: {Blank single:19197::"yes","no"} Runny nose: {Blank single:19197::"yes","no"} Post nasal drip: {Blank single:19197::"yes","no"} Sneezing: {Blank single:19197::"yes","no"} Sore throat: {Blank single:19197::"yes","no"} Swollen glands: {Blank single:19197::"yes","no"} Sinus pressure: {Blank single:19197::"yes","no"} Headache: {Blank single:19197::"yes","no"} Face pain: {Blank single:19197::"yes","no"} Toothache: {Blank single:19197::"yes","no"} Ear pain: {Blank single:19197::"yes","no"} {Blank single:19197::""right","left", "bilateral"} Ear pressure: {Blank single:19197::"yes","no"} {Blank single:19197::""right","left", "bilateral"} Eyes red/itching:{Blank single:19197::"yes","no"} Eye drainage/crusting: {Blank single:19197::"yes","no"}  Vomiting: {Blank single:19197::"yes","no"} Rash: {Blank single:19197::"yes","no"} Fatigue: {Blank single:19197::"yes","no"} Sick contacts: {Blank single:19197::"yes","no"} Strep contacts: {Blank single:19197::"yes","no"}  Context: {Blank multiple:19196::"better","worse","stable","fluctuating"} Recurrent sinusitis: {Blank single:19197::"yes","no"} Relief with OTC cold/cough medications: {Blank single:19197::"yes","no"}  Treatments attempted: {Blank  multiple:19196::"none","cold/sinus","mucinex","anti-histamine","pseudoephedrine","cough syrup","antibiotics"}   Relevant past medical, surgical, family and social history reviewed and updated as indicated. Interim medical history since our last visit reviewed. Allergies and medications reviewed and updated.  Review of Systems  Per HPI unless specifically indicated above     Objective:    There were no vitals taken for this visit.  Wt Readings from Last 3 Encounters:  01/30/23 100 lb (45.4 kg) (3%, Z= -1.82)*  11/08/22 101 lb 4.8 oz (45.9 kg) (4%, Z= -1.70)*  11/07/22 103 lb 12.8 oz (47.1 kg) (7%, Z= -1.49)*   * Growth percentiles are based on CDC (Girls, 2-20 Years) data.    Physical Exam  Results for orders placed or performed during the hospital encounter of 01/30/23  Wet prep, genital  Result Value Ref Range   Yeast Wet Prep HPF POC NONE SEEN NONE SEEN   Trich, Wet Prep NONE SEEN NONE SEEN   Clue Cells Wet Prep HPF POC NONE SEEN NONE SEEN   WBC, Wet Prep HPF POC <10 (A) <10   Sperm NONE SEEN   Urinalysis, w/ Reflex to Culture (Infection Suspected) -Urine, Clean Catch  Result Value Ref Range   Specimen Source URINE, CLEAN CATCH    Color, Urine YELLOW YELLOW   APPearance CLEAR CLEAR   Specific Gravity, Urine >1.030 (H) 1.005 - 1.030   pH 5.5 5.0 - 8.0   Glucose, UA NEGATIVE NEGATIVE mg/dL   Hgb urine dipstick NEGATIVE NEGATIVE   Bilirubin Urine SMALL (A) NEGATIVE   Ketones, ur 15 (A) NEGATIVE mg/dL   Protein, ur NEGATIVE NEGATIVE mg/dL   Nitrite NEGATIVE NEGATIVE   Leukocytes,Ua NEGATIVE NEGATIVE   Squamous Epithelial / HPF 6-10 0 - 5 /HPF   WBC, UA 11-20 0 - 5 WBC/hpf   RBC / HPF 0-5 0 - 5 RBC/hpf   Bacteria, UA FEW (A) NONE SEEN   Mucus PRESENT   Pregnancy, urine  Result Value Ref Range   Preg Test, Ur NEGATIVE NEGATIVE      Assessment & Plan:   Problem List Items Addressed This Visit   None    Follow up plan: No follow-ups on file.

## 2023-03-13 ENCOUNTER — Encounter: Payer: Self-pay | Admitting: Nurse Practitioner

## 2023-03-13 ENCOUNTER — Ambulatory Visit: Payer: MEDICAID | Admitting: Nurse Practitioner

## 2023-03-13 VITALS — BP 114/74 | HR 81 | Temp 98.0°F | Wt 105.2 lb

## 2023-03-13 DIAGNOSIS — R051 Acute cough: Secondary | ICD-10-CM | POA: Diagnosis not present

## 2023-03-13 DIAGNOSIS — J069 Acute upper respiratory infection, unspecified: Secondary | ICD-10-CM

## 2023-03-13 DIAGNOSIS — J029 Acute pharyngitis, unspecified: Secondary | ICD-10-CM

## 2023-03-13 NOTE — Progress Notes (Signed)
BP 114/74   Pulse 81   Temp 98 F (36.7 C) (Oral)   Wt 105 lb 3.2 oz (47.7 kg)   LMP 02/04/2023 (Approximate)   SpO2 99%   BMI 20.55 kg/m    Subjective:    Patient ID: Rhonda Fox, female    DOB: Sep 23, 2000, 22 y.o.   MRN: [id number]  HPI: Rhonda Fox is a 22 y.o. female  Chief Complaint  Patient presents with   Cough   Headache    Pt states she was not feeling well last Sunday but is feeling better today    Sore Throat   UPPER RESPIRATORY TRACT INFECTION Worst symptom: symptoms started sunday Fever: no Cough: yes Shortness of breath: yes Wheezing: no Chest pain: no Chest tightness: no Chest congestion: yes Nasal congestion: yes Runny nose: yes Post nasal drip: yes Sneezing: yes Sore throat: yes Swollen glands: yes Sinus pressure: yes Headache: yes Face pain: no Toothache: no Ear pain: no bilateral Ear pressure: no bilateral Eyes red/itching:no Eye drainage/crusting: no  Vomiting: no Rash: no Fatigue: yes Sick contacts: no Strep contacts: no  Context: better Recurrent sinusitis: no Relief with OTC cold/cough medications: yes  Treatments attempted: cold/sinus and mucinex   Relevant past medical, surgical, family and social history reviewed and updated as indicated. Interim medical history since our last visit reviewed. Allergies and medications reviewed and updated.  Review of Systems  Constitutional:  Positive for fatigue. Negative for fever.  HENT:  Positive for congestion, postnasal drip, rhinorrhea, sinus pressure, sinus pain, sneezing and sore throat. Negative for dental problem and ear pain.   Respiratory:  Positive for cough and shortness of breath. Negative for wheezing.   Cardiovascular:  Negative for chest pain.  Gastrointestinal:  Negative for vomiting.  Skin:  Negative for rash.  Neurological:  Positive for headaches.    Per HPI unless specifically indicated above     Objective:    BP 114/74   Pulse 81   Temp 98 F  (36.7 C) (Oral)   Wt 105 lb 3.2 oz (47.7 kg)   LMP 02/04/2023 (Approximate)   SpO2 99%   BMI 20.55 kg/m   Wt Readings from Last 3 Encounters:  03/13/23 105 lb 3.2 oz (47.7 kg)  01/30/23 100 lb (45.4 kg) (3%, Z= -1.82)*  11/08/22 101 lb 4.8 oz (45.9 kg) (4%, Z= -1.70)*   * Growth percentiles are based on CDC (Girls, 2-20 Years) data.    Physical Exam Vitals and nursing note reviewed.  Constitutional:      General: She is not in acute distress.    Appearance: Normal appearance. She is normal weight. She is not ill-appearing, toxic-appearing or diaphoretic.  HENT:     Head: Normocephalic.     Right Ear: External ear normal. A middle ear effusion is present. Tympanic membrane is not erythematous.     Left Ear: External ear normal. A middle ear effusion is present. Tympanic membrane is not erythematous.     Nose: Congestion and rhinorrhea present.     Right Sinus: No maxillary sinus tenderness or frontal sinus tenderness.     Left Sinus: No maxillary sinus tenderness or frontal sinus tenderness.     Mouth/Throat:     Mouth: Mucous membranes are moist.     Pharynx: Oropharynx is clear. Posterior oropharyngeal erythema present. No oropharyngeal exudate.     Tonsils: No tonsillar exudate.  Eyes:     General:        Right eye: No discharge.  Left eye: No discharge.     Extraocular Movements: Extraocular movements intact.     Conjunctiva/sclera: Conjunctivae normal.     Pupils: Pupils are equal, round, and reactive to light.  Cardiovascular:     Rate and Rhythm: Normal rate and regular rhythm.     Heart sounds: No murmur heard. Pulmonary:     Effort: Pulmonary effort is normal. No respiratory distress.     Breath sounds: Normal breath sounds. No wheezing or rales.  Musculoskeletal:     Cervical back: Normal range of motion and neck supple.  Lymphadenopathy:     Cervical: Cervical adenopathy present.     Right cervical: Superficial cervical adenopathy present.     Left  cervical: Superficial cervical adenopathy present.  Skin:    General: Skin is warm and dry.     Capillary Refill: Capillary refill takes less than 2 seconds.  Neurological:     General: No focal deficit present.     Mental Status: She is alert and oriented to person, place, and time. Mental status is at baseline.  Psychiatric:        Mood and Affect: Mood normal.        Behavior: Behavior normal.        Thought Content: Thought content normal.        Judgment: Judgment normal.     Results for orders placed or performed during the hospital encounter of 01/30/23  Wet prep, genital  Result Value Ref Range   Yeast Wet Prep HPF POC NONE SEEN NONE SEEN   Trich, Wet Prep NONE SEEN NONE SEEN   Clue Cells Wet Prep HPF POC NONE SEEN NONE SEEN   WBC, Wet Prep HPF POC <10 (A) <10   Sperm NONE SEEN   Urinalysis, w/ Reflex to Culture (Infection Suspected) -Urine, Clean Catch  Result Value Ref Range   Specimen Source URINE, CLEAN CATCH    Color, Urine YELLOW YELLOW   APPearance CLEAR CLEAR   Specific Gravity, Urine >1.030 (H) 1.005 - 1.030   pH 5.5 5.0 - 8.0   Glucose, UA NEGATIVE NEGATIVE mg/dL   Hgb urine dipstick NEGATIVE NEGATIVE   Bilirubin Urine SMALL (A) NEGATIVE   Ketones, ur 15 (A) NEGATIVE mg/dL   Protein, ur NEGATIVE NEGATIVE mg/dL   Nitrite NEGATIVE NEGATIVE   Leukocytes,Ua NEGATIVE NEGATIVE   Squamous Epithelial / HPF 6-10 0 - 5 /HPF   WBC, UA 11-20 0 - 5 WBC/hpf   RBC / HPF 0-5 0 - 5 RBC/hpf   Bacteria, UA FEW (A) NONE SEEN   Mucus PRESENT   Pregnancy, urine  Result Value Ref Range   Preg Test, Ur NEGATIVE NEGATIVE      Assessment & Plan:   Problem List Items Addressed This Visit   None Visit Diagnoses     Viral upper respiratory tract infection    -  Primary   Flu and Strep negative in office. COVID sent out. Symptoms are improving. Recommend increasing hydration and continuing with OTC symptom management.   Acute cough       Relevant Orders   Veritor Flu A/B  Waived   Novel Coronavirus, NAA ([facility])   Sore throat       Relevant Orders   Rapid Strep screen([facility])        Follow up plan: No follow-ups on file.

## 2023-03-15 LAB — NOVEL CORONAVIRUS, NAA: SARS-CoV-2, NAA: NOT DETECTED

## 2023-03-16 LAB — CULTURE, GROUP A STREP: Strep A Culture: NEGATIVE

## 2023-03-16 LAB — VERITOR FLU A/B WAIVED
Influenza A: NEGATIVE
Influenza B: NEGATIVE

## 2023-03-16 LAB — RAPID STREP SCREEN (MED CTR MEBANE ONLY): Strep Gp A Ag, IA W/Reflex: NEGATIVE

## 2023-04-23 ENCOUNTER — Encounter: Payer: Self-pay | Admitting: *Deleted

## 2023-04-23 ENCOUNTER — Ambulatory Visit: Payer: Self-pay | Admitting: *Deleted

## 2023-04-23 ENCOUNTER — Emergency Department
Admission: EM | Admit: 2023-04-23 | Discharge: 2023-04-24 | Disposition: A | Discharge status: Home / Self Care | Payer: MEDICAID | Attending: Emergency Medicine | Admitting: Emergency Medicine

## 2023-04-23 ENCOUNTER — Other Ambulatory Visit: Payer: Self-pay

## 2023-04-23 DIAGNOSIS — F840 Autistic disorder: Secondary | ICD-10-CM | POA: Diagnosis not present

## 2023-04-23 DIAGNOSIS — N764 Abscess of vulva: Secondary | ICD-10-CM | POA: Diagnosis present

## 2023-04-23 MED ORDER — LIDOCAINE HCL (PF) 1 % IJ SOLN
5.0000 mL | Freq: Once | INTRAMUSCULAR | Status: AC
  Administered 2023-04-24: 5 mL via INTRADERMAL
  Filled 2023-04-23: qty 5

## 2023-04-23 MED ORDER — CEPHALEXIN 500 MG PO CAPS
500.0000 mg | ORAL_CAPSULE | Freq: Once | ORAL | Status: AC
  Administered 2023-04-24: 500 mg via ORAL
  Filled 2023-04-23: qty 1

## 2023-04-23 MED ORDER — IBUPROFEN 800 MG PO TABS
800.0000 mg | ORAL_TABLET | Freq: Once | ORAL | Status: AC
  Administered 2023-04-24: 800 mg via ORAL
  Filled 2023-04-23: qty 1

## 2023-04-23 NOTE — Telephone Encounter (Signed)
Attempted to call pt.    Voicemail is full      Has an appt tomorrow with Larae Grooms, NP at 1:00 for this boil.

## 2023-04-23 NOTE — Telephone Encounter (Signed)
Message from Samson P sent at 04/23/2023  2:56 PM EST  Summary: boil   Pt called saying she has a boil on her upper inner thigh area.  She has an appt tomorrow but she says it is hurting now  CB@  [phone]          Call History  Contact Date/Time Type Contact Phone/Fax User  04/23/2023 02:55 PM EST Phone (Incoming) Rhonda Fox, Rhonda Fox (Self) [phone] Judie Petit) Daphine Deutscher D

## 2023-04-23 NOTE — ED Provider Notes (Signed)
[facility] Provider Note    Event Date/Time   First MD Initiated Contact with Patient 04/23/23 2340     (approximate)   History   Abscess   HPI  Rhonda Fox is a 22 y.o. female with history of autism, anxiety who presents to the emergency department with abscess and cellulitis to the right labia.  She reports fever at home of 100.  She is not a diabetic.  It has not been draining.   History provided by patient.    Past Medical History:  Diagnosis Date   Anxiety    Autism    Pectus excavatum     History reviewed. No pertinent surgical history.  MEDICATIONS:  Prior to Admission medications   Medication Sig Start Date End Date Taking? Authorizing Provider  amitriptyline (ELAVIL) 25 MG tablet Take 1 tablet (25 mg total) by mouth at bedtime. Patient not taking: Reported on 11/08/2022 11/07/22   Angelina Sheriff, DO  Cholecalciferol (VITAMIN D3) 125 MCG (5000 UT) CAPS Take 1 capsule (5,000 Units total) by mouth daily. Patient not taking: Reported on 11/08/2022 10/22/22   Larae Grooms, NP  ibuprofen (ADVIL) 600 MG tablet Take 1 tablet (600 mg total) by mouth every 6 (six) hours as needed for moderate pain. 01/30/23   Eusebio Friendly B, PA-C  lidocaine (XYLOCAINE) 2 % solution Use as directed 15 mLs in the mouth or throat as needed for mouth pain. 11/08/22   Dorcas Carrow, DO    Physical Exam   Triage Vital Signs: ED Triage Vitals  Encounter Vitals Group     BP 04/23/23 2109 (!) 115/96     Systolic BP Percentile --      Diastolic BP Percentile --      Pulse Rate 04/23/23 2109 89     Resp 04/23/23 2109 18     Temp 04/23/23 2109 98.6 F (37 C)     Temp Source 04/23/23 2109 Oral     SpO2 04/23/23 2109 99 %     Weight 04/23/23 2107 105 lb (47.6 kg)     Height 04/23/23 2107 5' (1.524 m)     Head Circumference --      Peak Flow --      Pain Score 04/23/23 2107 10     Pain Loc --      Pain Education --      Exclude from Growth Chart --      Most recent vital signs: Vitals:   04/23/23 2109 04/24/23 0028  BP: (!) 115/96 112/74  Pulse: 89 84  Resp: 18 20  Temp: 98.6 F (37 C) 98.3 F (36.8 C)  SpO2: 99% 100%     CONSTITUTIONAL: Alert and responds appropriately to questions.  Appears uncomfortable, afebrile and nontoxic HEAD: Normocephalic, atraumatic EYES: Conjunctivae clear, pupils appear equal ENT: normal nose; moist mucous membranes NECK: Normal range of motion CARD: Regular rate and rhythm RESP: Normal chest excursion without splinting or tachypnea; no hypoxia or respiratory distress, speaking full sentences ABD/GI: non-distended GU: Patient has a fluctuant area to the right labia majora and inguinal area with surrounding erythema and warmth that is approximately 4 x 5 cm without crepitus or extension into the perineum, no drainage EXT: Normal ROM in all joints, no major deformities noted SKIN: Normal color for age and race, no rashes on exposed skin NEURO: Moves all extremities equally, normal speech, no facial asymmetry noted PSYCH: The patient's mood and manner are appropriate. Grooming and personal hygiene  are appropriate.  ED Results / Procedures / Treatments   LABS: (all labs ordered are listed, but only abnormal results are displayed) Labs Reviewed  AEROBIC CULTURE W GRAM STAIN (SUPERFICIAL SPECIMEN)     EKG:   RADIOLOGY: My personal review and interpretation of imaging:    I have personally reviewed all radiology reports. No results found.   PROCEDURES:  Critical Care performed: No   INCISION AND DRAINAGE Performed by: Baxter Hire Caelin Rosen Consent: Verbal consent obtained. Risks and benefits: risks, benefits and alternatives were discussed Type: abscess  Body area: right labia  Anesthesia: local infiltration  Incision was made with a scalpel.  Local anesthetic: lidocaine 1% wihtout epinephrine  Anesthetic total: 4 ml  Complexity: complex Blunt dissection to break up  loculations  Drainage: purulent  Drainage amount: large  Packing material: none  Patient tolerance: Patient tolerated the procedure well with no immediate complications.     Procedures    IMPRESSION / MDM / ASSESSMENT AND PLAN / ED COURSE  I reviewed the triage vital signs and the nursing notes.   Patient here with abscess and cellulitis to the labia.     DIFFERENTIAL DIAGNOSIS (includes but not limited to):   Abscess, cellulitis, no sign of Fournier's gangrene, necrotizing fasciitis  Patient's presentation is most consistent with acute complicated illness / injury requiring diagnostic workup.  PLAN: Performed incision and drainage at bedside with chaperone present.  Patient tolerated procedure well.  Wound culture sent.  Will start on Keflex.  Will discharge with short course of pain medication.  Recommended warm compresses, soaking in a warm bath several times a day to keep this open and draining.  She reports low-grade temperature of 100 at home but is afebrile here, nontoxic in appearance.  I feel she is safe for discharge home.  Patient comfortable with this plan.   MEDICATIONS GIVEN IN ED: Medications  bacitracin ointment ( Topical Given 04/24/23 0057)  lidocaine (PF) (XYLOCAINE) 1 % injection 5 mL (5 mLs Intradermal Given 04/24/23 0012)  ibuprofen (ADVIL) tablet 800 mg (800 mg Oral Given 04/24/23 0011)  cephALEXin (KEFLEX) capsule 500 mg (500 mg Oral Given 04/24/23 0011)     ED COURSE:  At this time, I do not feel there is any life-threatening condition present. I reviewed all nursing notes, vitals, pertinent previous records.  All lab and urine results, EKGs, imaging ordered have been independently reviewed and interpreted by myself.  I reviewed all available radiology reports from any imaging ordered this visit.  Based on my assessment, I feel the patient is safe to be discharged home without further emergent workup and can continue workup as an outpatient as  needed. Discussed all findings, treatment plan as well as usual and customary return precautions.  They verbalize understanding and are comfortable with this plan.  Outpatient follow-up has been provided as needed.  All questions have been answered.    CONSULTS:  none   OUTSIDE RECORDS REVIEWED: Reviewed last family medicine visit on 03/13/2023.     FINAL CLINICAL IMPRESSION(S) / ED DIAGNOSES   Final diagnoses:  Abscess of right genital labia     Rx / DC Orders   ED Discharge Orders          Ordered    cephALEXin (KEFLEX) 500 MG capsule  4 times daily        04/24/23 0047    HYDROcodone-acetaminophen (NORCO/VICODIN) 5-325 MG tablet  Every 8 hours PRN        04/24/23 0047  ondansetron (ZOFRAN-ODT) 4 MG disintegrating tablet  Every 6 hours PRN        04/24/23 0047             Note:  This document was prepared using Dragon voice recognition software and may include unintentional dictation errors.   Tammie Yanda, Layla Maw, DO 04/24/23 [phone]

## 2023-04-23 NOTE — Telephone Encounter (Signed)
Second attempt to reach pt, VM full. Routing to practice for PCPs resolution per protocol.

## 2023-04-23 NOTE — ED Triage Notes (Signed)
Pt has swelling to right labia area.  No drainage.  Sx for 1 week.  Pt alert.

## 2023-04-24 ENCOUNTER — Encounter: Payer: Self-pay | Admitting: Nurse Practitioner

## 2023-04-24 ENCOUNTER — Encounter: Payer: MEDICAID | Admitting: Nurse Practitioner

## 2023-04-24 MED ORDER — ONDANSETRON 4 MG PO TBDP: 4.0000 mg | ORAL_TABLET | Freq: Four times a day (QID) | ORAL | 0 refills | Status: DC | PRN

## 2023-04-24 MED ORDER — BACITRACIN ZINC 500 UNIT/GM EX OINT
TOPICAL_OINTMENT | CUTANEOUS | Status: AC
  Filled 2023-04-24: qty 0.9

## 2023-04-24 MED ORDER — HYDROCODONE-ACETAMINOPHEN 5-325 MG PO TABS: 2.0000 | ORAL_TABLET | Freq: Three times a day (TID) | ORAL | 0 refills | Status: DC | PRN

## 2023-04-24 MED ORDER — CEPHALEXIN 500 MG PO CAPS
500.0000 mg | ORAL_CAPSULE | Freq: Four times a day (QID) | ORAL | 0 refills | Status: DC
Start: 2023-04-24 — End: 2023-09-23

## 2023-04-24 MED ORDER — BACITRACIN ZINC 500 UNIT/GM EX OINT: TOPICAL_OINTMENT | Freq: Two times a day (BID) | CUTANEOUS | Status: DC

## 2023-04-24 NOTE — Discharge Instructions (Signed)

## 2023-04-24 NOTE — Progress Notes (Unsigned)
LMP 04/03/2023 (Approximate)    Subjective:    Patient ID: Rhonda Fox, female    DOB: 03-Jan-2001, 22 y.o.   MRN: [id number]  HPI: Rhonda Fox is a 22 y.o. female presenting on 04/24/2023 for comprehensive medical examination. Current medical complaints include: body aches  She currently lives with: Menopausal Symptoms: no  Patient states she has been having a lot of back pain recently.  She has a hard time sitting straight up.  Has a hard time getting comfortable.     Denies HA, CP, SOB, dizziness, palpitations, visual changes, and lower extremity swelling.   Depression Screen done today and results listed below:     03/13/2023    3:13 PM 11/08/2022    4:07 PM 11/07/2022    2:25 PM 10/22/2022    1:19 PM 07/17/2022    8:38 AM  Depression screen PHQ 2/9  Decreased Interest 2 3 3 3 3   Down, Depressed, Hopeless 2 3 3 2 3   PHQ - 2 Score 4 6 6 5 6   Altered sleeping 3 3 3 3 3   Tired, decreased energy 2 3 3 3 3   Change in appetite 1 3 3 3 2   Feeling bad or failure about yourself  0 0 3 3 3   Trouble concentrating 3 1 2 3 3   Moving slowly or fidgety/restless 3 0 3 3 3   Suicidal thoughts 1 0 2 0 1  PHQ-9 Score 17 16 25 23 24   Difficult doing work/chores Very difficult Very difficult Extremely dIfficult Extremely dIfficult Extremely dIfficult    The patient does not have a history of falls. I did complete a risk assessment for falls. A plan of care for falls was documented.   Past Medical History:  Past Medical History:  Diagnosis Date  . Anxiety   . Autism   . Pectus excavatum     Surgical History:  No past surgical history on file.  Medications:  Current Outpatient Medications on File Prior to Visit  Medication Sig  . amitriptyline (ELAVIL) 25 MG tablet Take 1 tablet (25 mg total) by mouth at bedtime. (Patient not taking: Reported on 11/08/2022)  . cephALEXin (KEFLEX) 500 MG capsule Take 1 capsule (500 mg total) by mouth 4 (four) times daily.  .  Cholecalciferol (VITAMIN D3) 125 MCG (5000 UT) CAPS Take 1 capsule (5,000 Units total) by mouth daily. (Patient not taking: Reported on 11/08/2022)  . HYDROcodone-acetaminophen (NORCO/VICODIN) 5-325 MG tablet Take 2 tablets by mouth every 8 (eight) hours as needed.  Marland Kitchen ibuprofen (ADVIL) 600 MG tablet Take 1 tablet (600 mg total) by mouth every 6 (six) hours as needed for moderate pain.  Marland Kitchen lidocaine (XYLOCAINE) 2 % solution Use as directed 15 mLs in the mouth or throat as needed for mouth pain.  Marland Kitchen ondansetron (ZOFRAN-ODT) 4 MG disintegrating tablet Take 1 tablet (4 mg total) by mouth every 6 (six) hours as needed for nausea or vomiting.   No current facility-administered medications on file prior to visit.    Allergies:  Allergies  Allergen Reactions  . Kiwi Extract Anaphylaxis    Social History:  Social History   Socioeconomic History  . Marital status: Significant Other    Spouse name: Not on file  . Number of children: 0  . Years of education: Not on file  . Highest education level: Associate degree: academic program  Occupational History  . Not on file  Tobacco Use  . Smoking status: Never  . Smokeless tobacco: Never  Vaping Use  . Vaping status: Never Used  Substance and Sexual Activity  . Alcohol use: Not Currently    Comment: Socially  . Drug use: Yes    Types: Marijuana  . Sexual activity: Yes    Partners: Male    Birth control/protection: None  Other Topics Concern  . Not on file  Social History Narrative  . Not on file   Social Determinants of Health   Financial Resource Strain: Medium Risk (10/22/2022)   Overall Financial Resource Strain (CARDIA)   . Difficulty of Paying Living Expenses: Somewhat hard  Food Insecurity: Food Insecurity Present (10/22/2022)   Hunger Vital Sign   . Worried About Programme researcher, broadcasting/film/video in the Last Year: Sometimes true   . Ran Out of Food in the Last Year: Sometimes true  Transportation Needs: Unmet Transportation Needs (10/22/2022)    PRAPARE - Transportation   . Lack of Transportation (Medical): Yes   . Lack of Transportation (Non-Medical): Yes  Physical Activity: Unknown (10/22/2022)   Exercise Vital Sign   . Days of Exercise per Week: 0 days   . Minutes of Exercise per Session: Not on file  Stress: Stress Concern Present (10/22/2022)   [employer] of Occupational Health - Occupational Stress Questionnaire   . Feeling of Stress : Very much  Social Connections: Unknown (10/22/2022)   Social Connection and Isolation Panel [NHANES]   . Frequency of Communication with Friends and Family: More than three times a week   . Frequency of Social Gatherings with Friends and Family: Never   . Attends Religious Services: 1 to 4 times per year   . Active Member of Clubs or Organizations: No   . Attends Banker Meetings: Not on file   . Marital Status: Patient declined  Intimate Partner Violence: Not At Risk (09/04/2021)   Humiliation, Afraid, Rape, and Kick questionnaire   . Fear of Current or Ex-Partner: No   . Emotionally Abused: No   . Physically Abused: No   . Sexually Abused: No   Social History   Tobacco Use  Smoking Status Never  Smokeless Tobacco Never   Social History   Substance and Sexual Activity  Alcohol Use Not Currently   Comment: Socially    Family History:  Family History  Problem Relation Age of Onset  . Alcohol abuse Brother   . Suicidality Paternal Uncle     Past medical history, surgical history, medications, allergies, family history and social history reviewed with patient today and changes made to appropriate areas of the chart.   Review of Systems  Eyes:  Negative for blurred vision and double vision.  Respiratory:  Negative for shortness of breath.   Cardiovascular:  Negative for chest pain, palpitations and leg swelling.  Musculoskeletal:  Positive for back pain.  Neurological:  Negative for dizziness and headaches.   All other ROS negative except what is  listed above and in the HPI.      Objective:    LMP 04/03/2023 (Approximate)   Wt Readings from Last 3 Encounters:  04/23/23 105 lb (47.6 kg)  03/13/23 105 lb 3.2 oz (47.7 kg)  01/30/23 100 lb (45.4 kg) (3%, Z= -1.82)*   * Growth percentiles are based on CDC (Girls, 2-20 Years) data.    Physical Exam Vitals and nursing note reviewed.  Constitutional:      General: She is awake. She is not in acute distress.    Appearance: Normal appearance. She is well-developed and normal weight. She  is not ill-appearing.  HENT:     Head: Normocephalic and atraumatic.     Right Ear: Hearing, tympanic membrane, ear canal and external ear normal. No drainage.     Left Ear: Hearing, tympanic membrane, ear canal and external ear normal. No drainage.     Nose: Nose normal.     Right Sinus: No maxillary sinus tenderness or frontal sinus tenderness.     Left Sinus: No maxillary sinus tenderness or frontal sinus tenderness.     Mouth/Throat:     Mouth: Mucous membranes are moist.     Pharynx: Oropharynx is clear. Uvula midline. No pharyngeal swelling, oropharyngeal exudate or posterior oropharyngeal erythema.  Eyes:     General: Lids are normal.        Right eye: No discharge.        Left eye: No discharge.     Extraocular Movements: Extraocular movements intact.     Conjunctiva/sclera: Conjunctivae normal.     Pupils: Pupils are equal, round, and reactive to light.     Visual Fields: Right eye visual fields normal and left eye visual fields normal.  Neck:     Thyroid: No thyromegaly.     Vascular: No carotid bruit.     Trachea: Trachea normal.  Cardiovascular:     Rate and Rhythm: Normal rate and regular rhythm.     Heart sounds: Normal heart sounds. No murmur heard.    No gallop.  Pulmonary:     Effort: Pulmonary effort is normal. No accessory muscle usage or respiratory distress.     Breath sounds: Normal breath sounds.  Chest:  Breasts:    Right: Normal.     Left: Normal.  Abdominal:      General: Bowel sounds are normal.     Palpations: Abdomen is soft. There is no hepatomegaly or splenomegaly.     Tenderness: There is no abdominal tenderness.  Musculoskeletal:        General: Normal range of motion.     Cervical back: Normal range of motion and neck supple.     Right lower leg: No edema.     Left lower leg: No edema.  Lymphadenopathy:     Head:     Right side of head: No submental, submandibular, tonsillar, preauricular or posterior auricular adenopathy.     Left side of head: No submental, submandibular, tonsillar, preauricular or posterior auricular adenopathy.     Cervical: No cervical adenopathy.     Upper Body:     Right upper body: No supraclavicular, axillary or pectoral adenopathy.     Left upper body: No supraclavicular, axillary or pectoral adenopathy.  Skin:    General: Skin is warm and dry.     Capillary Refill: Capillary refill takes less than 2 seconds.     Findings: No rash.  Neurological:     Mental Status: She is alert and oriented to person, place, and time.     Gait: Gait is intact.  Psychiatric:        Attention and Perception: Attention normal.        Mood and Affect: Mood normal.        Speech: Speech normal.        Behavior: Behavior normal. Behavior is cooperative.        Thought Content: Thought content normal.        Judgment: Judgment normal.    Results for orders placed or performed during the hospital encounter of 04/23/23  Aerobic Culture w Gram  Stain (superficial specimen)   Specimen: Abscess  Result Value Ref Range   Specimen Description      ABSCESS Performed at [facility], [address] [place], [place] [address]    Special Requests      NONE Performed at [facility], [address] [place], [place] [address]    Gram Stain      FEW WBC PRESENT,BOTH PMN AND MONONUCLEAR FEW GRAM POSITIVE COCCI IN CLUSTERS FEW GRAM NEGATIVE RODS Performed at [facility], [address],  [place], [place] [address]    Culture PENDING    Report Status PENDING       Assessment & Plan:   Problem List Items Addressed This Visit   None     Follow up plan: No follow-ups on file.   LABORATORY TESTING:  - Pap smear: not applicable  IMMUNIZATIONS:   - Tdap: Tetanus vaccination status reviewed: last tetanus booster within 10 years. - Influenza: Refused - Pneumovax: Not applicable - Prevnar: Not applicable - COVID: Not applicable - HPV: Administered today - Shingrix vaccine: Not applicable  SCREENING: -Mammogram: Not applicable  - Colonoscopy: Not applicable  - Bone Density: Not applicable  -Hearing Test: Not applicable  -Spirometry: Not applicable   PATIENT COUNSELING:   Advised to take 1 mg of folate supplement per day if capable of pregnancy.   Sexuality: Discussed sexually transmitted diseases, partner selection, use of condoms, avoidance of unintended pregnancy  and contraceptive alternatives.   Advised to avoid cigarette smoking.  I discussed with the patient that most people either abstain from alcohol or drink within safe limits (<=14/week and <=4 drinks/occasion for males, <=7/weeks and <= 3 drinks/occasion for females) and that the risk for alcohol disorders and other health effects rises proportionally with the number of drinks per week and how often a drinker exceeds daily limits.  Discussed cessation/primary prevention of drug use and availability of treatment for abuse.   Diet: Encouraged to adjust caloric intake to maintain  or achieve ideal body weight, to reduce intake of dietary saturated fat and total fat, to limit sodium intake by avoiding high sodium foods and not adding table salt, and to maintain adequate dietary potassium and calcium preferably from fresh fruits, vegetables, and low-fat dairy products.    stressed the importance of regular exercise  Injury prevention: Discussed safety belts, safety helmets, smoke detector, smoking near bedding  or upholstery.   Dental health: Discussed importance of regular tooth brushing, flossing, and dental visits.    NEXT PREVENTATIVE PHYSICAL DUE IN 1 YEAR. No follow-ups on file.

## 2023-04-25 ENCOUNTER — Ambulatory Visit: Payer: MEDICAID | Admitting: Nurse Practitioner

## 2023-04-25 NOTE — Progress Notes (Unsigned)
LMP 04/03/2023 (Approximate)    Subjective:    Patient ID: Rhonda Fox, female    DOB: Sep 30, 2000, 22 y.o.   MRN: [id number]  HPI: Rhonda Fox is a 22 y.o. female presenting on 04/25/2023 for comprehensive medical examination. Current medical complaints include: body aches  She currently lives with: Menopausal Symptoms: no  Patient states she has been having a lot of back pain recently.  She has a hard time sitting straight up.  Has a hard time getting comfortable.     Denies HA, CP, SOB, dizziness, palpitations, visual changes, and lower extremity swelling.   Depression Screen done today and results listed below:     03/13/2023    3:13 PM 11/08/2022    4:07 PM 11/07/2022    2:25 PM 10/22/2022    1:19 PM 07/17/2022    8:38 AM  Depression screen PHQ 2/9  Decreased Interest 2 3 3 3 3   Down, Depressed, Hopeless 2 3 3 2 3   PHQ - 2 Score 4 6 6 5 6   Altered sleeping 3 3 3 3 3   Tired, decreased energy 2 3 3 3 3   Change in appetite 1 3 3 3 2   Feeling bad or failure about yourself  0 0 3 3 3   Trouble concentrating 3 1 2 3 3   Moving slowly or fidgety/restless 3 0 3 3 3   Suicidal thoughts 1 0 2 0 1  PHQ-9 Score 17 16 25 23 24   Difficult doing work/chores Very difficult Very difficult Extremely dIfficult Extremely dIfficult Extremely dIfficult    The patient does not have a history of falls. I did complete a risk assessment for falls. A plan of care for falls was documented.   Past Medical History:  Past Medical History:  Diagnosis Date   Anxiety    Autism    Pectus excavatum     Surgical History:  No past surgical history on file.  Medications:  Current Outpatient Medications on File Prior to Visit  Medication Sig   amitriptyline (ELAVIL) 25 MG tablet Take 1 tablet (25 mg total) by mouth at bedtime. (Patient not taking: Reported on 11/08/2022)   cephALEXin (KEFLEX) 500 MG capsule Take 1 capsule (500 mg total) by mouth 4 (four) times daily.   Cholecalciferol  (VITAMIN D3) 125 MCG (5000 UT) CAPS Take 1 capsule (5,000 Units total) by mouth daily. (Patient not taking: Reported on 11/08/2022)   HYDROcodone-acetaminophen (NORCO/VICODIN) 5-325 MG tablet Take 2 tablets by mouth every 8 (eight) hours as needed.   ibuprofen (ADVIL) 600 MG tablet Take 1 tablet (600 mg total) by mouth every 6 (six) hours as needed for moderate pain.   lidocaine (XYLOCAINE) 2 % solution Use as directed 15 mLs in the mouth or throat as needed for mouth pain.   ondansetron (ZOFRAN-ODT) 4 MG disintegrating tablet Take 1 tablet (4 mg total) by mouth every 6 (six) hours as needed for nausea or vomiting.   No current facility-administered medications on file prior to visit.    Allergies:  Allergies  Allergen Reactions   Kiwi Extract Anaphylaxis    Social History:  Social History   Socioeconomic History   Marital status: Significant Other    Spouse name: Not on file   Number of children: 0   Years of education: Not on file   Highest education level: Associate degree: academic program  Occupational History   Not on file  Tobacco Use   Smoking status: Never   Smokeless tobacco: Never  Vaping Use   Vaping status: Never Used  Substance and Sexual Activity   Alcohol use: Not Currently    Comment: Socially   Drug use: Yes    Types: Marijuana   Sexual activity: Yes    Partners: Male    Birth control/protection: None  Other Topics Concern   Not on file  Social History Narrative   Not on file   Social Drivers of Health   Financial Resource Strain: Medium Risk (10/22/2022)   Overall Financial Resource Strain (CARDIA)    Difficulty of Paying Living Expenses: Somewhat hard  Food Insecurity: Food Insecurity Present (10/22/2022)   Hunger Vital Sign    Worried About Running Out of Food in the Last Year: Sometimes true    Ran Out of Food in the Last Year: Sometimes true  Transportation Needs: Unmet Transportation Needs (10/22/2022)   PRAPARE - Transportation    Lack of  Transportation (Medical): Yes    Lack of Transportation (Non-Medical): Yes  Physical Activity: Unknown (10/22/2022)   Exercise Vital Sign    Days of Exercise per Week: 0 days    Minutes of Exercise per Session: Not on file  Stress: Stress Concern Present (10/22/2022)   [employer] of Occupational Health - Occupational Stress Questionnaire    Feeling of Stress : Very much  Social Connections: Unknown (10/22/2022)   Social Connection and Isolation Panel [NHANES]    Frequency of Communication with Friends and Family: More than three times a week    Frequency of Social Gatherings with Friends and Family: Never    Attends Religious Services: 1 to 4 times per year    Active Member of [employer] or Organizations: No    Attends Banker Meetings: Not on file    Marital Status: Patient declined  Intimate Partner Violence: Not At Risk (09/04/2021)   Humiliation, Afraid, Rape, and Kick questionnaire    Fear of Current or Ex-Partner: No    Emotionally Abused: No    Physically Abused: No    Sexually Abused: No   Social History   Tobacco Use  Smoking Status Never  Smokeless Tobacco Never   Social History   Substance and Sexual Activity  Alcohol Use Not Currently   Comment: Socially    Family History:  Family History  Problem Relation Age of Onset   Alcohol abuse Brother    Suicidality Paternal Uncle     Past medical history, surgical history, medications, allergies, family history and social history reviewed with patient today and changes made to appropriate areas of the chart.   Review of Systems  Eyes:  Negative for blurred vision and double vision.  Respiratory:  Negative for shortness of breath.   Cardiovascular:  Negative for chest pain, palpitations and leg swelling.  Musculoskeletal:  Positive for back pain.  Neurological:  Negative for dizziness and headaches.   All other ROS negative except what is listed above and in the HPI.      Objective:    LMP  04/03/2023 (Approximate)   Wt Readings from Last 3 Encounters:  04/23/23 105 lb (47.6 kg)  03/13/23 105 lb 3.2 oz (47.7 kg)  01/30/23 100 lb (45.4 kg) (3%, Z= -1.82)*   * Growth percentiles are based on CDC (Girls, 2-20 Years) data.    Physical Exam Vitals and nursing note reviewed.  Constitutional:      General: She is awake. She is not in acute distress.    Appearance: Normal appearance. She is well-developed and normal weight. She  is not ill-appearing.  HENT:     Head: Normocephalic and atraumatic.     Right Ear: Hearing, tympanic membrane, ear canal and external ear normal. No drainage.     Left Ear: Hearing, tympanic membrane, ear canal and external ear normal. No drainage.     Nose: Nose normal.     Right Sinus: No maxillary sinus tenderness or frontal sinus tenderness.     Left Sinus: No maxillary sinus tenderness or frontal sinus tenderness.     Mouth/Throat:     Mouth: Mucous membranes are moist.     Pharynx: Oropharynx is clear. Uvula midline. No pharyngeal swelling, oropharyngeal exudate or posterior oropharyngeal erythema.  Eyes:     General: Lids are normal.        Right eye: No discharge.        Left eye: No discharge.     Extraocular Movements: Extraocular movements intact.     Conjunctiva/sclera: Conjunctivae normal.     Pupils: Pupils are equal, round, and reactive to light.     Visual Fields: Right eye visual fields normal and left eye visual fields normal.  Neck:     Thyroid: No thyromegaly.     Vascular: No carotid bruit.     Trachea: Trachea normal.  Cardiovascular:     Rate and Rhythm: Normal rate and regular rhythm.     Heart sounds: Normal heart sounds. No murmur heard.    No gallop.  Pulmonary:     Effort: Pulmonary effort is normal. No accessory muscle usage or respiratory distress.     Breath sounds: Normal breath sounds.  Chest:  Breasts:    Right: Normal.     Left: Normal.  Abdominal:     General: Bowel sounds are normal.     Palpations:  Abdomen is soft. There is no hepatomegaly or splenomegaly.     Tenderness: There is no abdominal tenderness.  Musculoskeletal:        General: Normal range of motion.     Cervical back: Normal range of motion and neck supple.     Right lower leg: No edema.     Left lower leg: No edema.  Lymphadenopathy:     Head:     Right side of head: No submental, submandibular, tonsillar, preauricular or posterior auricular adenopathy.     Left side of head: No submental, submandibular, tonsillar, preauricular or posterior auricular adenopathy.     Cervical: No cervical adenopathy.     Upper Body:     Right upper body: No supraclavicular, axillary or pectoral adenopathy.     Left upper body: No supraclavicular, axillary or pectoral adenopathy.  Skin:    General: Skin is warm and dry.     Capillary Refill: Capillary refill takes less than 2 seconds.     Findings: No rash.  Neurological:     Mental Status: She is alert and oriented to person, place, and time.     Gait: Gait is intact.  Psychiatric:        Attention and Perception: Attention normal.        Mood and Affect: Mood normal.        Speech: Speech normal.        Behavior: Behavior normal. Behavior is cooperative.        Thought Content: Thought content normal.        Judgment: Judgment normal.     Results for orders placed or performed during the hospital encounter of 04/23/23  Aerobic Culture w  Gram Stain (superficial specimen)   Collection Time: 04/24/23 12:20 AM   Specimen: Abscess  Result Value Ref Range   Specimen Description      ABSCESS Performed at [facility], [address] [place], [place] [address]    Special Requests      NONE Performed at [facility], [address] [place], [place] [address]    Gram Stain      FEW WBC PRESENT,BOTH PMN AND MONONUCLEAR FEW GRAM POSITIVE COCCI IN CLUSTERS FEW GRAM NEGATIVE RODS Performed at [facility], [address] [place], [place] [address]     Culture PENDING    Report Status PENDING       Assessment & Plan:   Problem List Items Addressed This Visit   None     Follow up plan: No follow-ups on file.   LABORATORY TESTING:  - Pap smear: not applicable  IMMUNIZATIONS:   - Tdap: Tetanus vaccination status reviewed: last tetanus booster within 10 years. - Influenza: Refused - Pneumovax: Not applicable - Prevnar: Not applicable - COVID: Not applicable - HPV: Administered today - Shingrix vaccine: Not applicable  SCREENING: -Mammogram: Not applicable  - Colonoscopy: Not applicable  - Bone Density: Not applicable  -Hearing Test: Not applicable  -Spirometry: Not applicable   PATIENT COUNSELING:   Advised to take 1 mg of folate supplement per day if capable of pregnancy.   Sexuality: Discussed sexually transmitted diseases, partner selection, use of condoms, avoidance of unintended pregnancy  and contraceptive alternatives.   Advised to avoid cigarette smoking.  I discussed with the patient that most people either abstain from alcohol or drink within safe limits (<=14/week and <=4 drinks/occasion for males, <=7/weeks and <= 3 drinks/occasion for females) and that the risk for alcohol disorders and other health effects rises proportionally with the number of drinks per week and how often a drinker exceeds daily limits.  Discussed cessation/primary prevention of drug use and availability of treatment for abuse.   Diet: Encouraged to adjust caloric intake to maintain  or achieve ideal body weight, to reduce intake of dietary saturated fat and total fat, to limit sodium intake by avoiding high sodium foods and not adding table salt, and to maintain adequate dietary potassium and calcium preferably from fresh fruits, vegetables, and low-fat dairy products.    stressed the importance of regular exercise  Injury prevention: Discussed safety belts, safety helmets, smoke detector, smoking near bedding or upholstery.    Dental health: Discussed importance of regular tooth brushing, flossing, and dental visits.    NEXT PREVENTATIVE PHYSICAL DUE IN 1 YEAR. No follow-ups on file.

## 2023-04-29 LAB — AEROBIC CULTURE W GRAM STAIN (SUPERFICIAL SPECIMEN)

## 2023-04-30 NOTE — Progress Notes (Signed)
ED Antimicrobial Stewardship Positive Culture Follow Up   Rhonda Fox is an 22 y.o. female who presented to [facility] on 04/23/2023 with a chief complaint of  Chief Complaint  Patient presents with   Abscess   Recent Results (from the past 720 hours)  Aerobic Culture w Gram Stain (superficial specimen)     Status: None   Collection Time: 04/24/23 12:20 AM   Specimen: Abscess  Result Value Ref Range Status   Specimen Description   Final    ABSCESS Performed at [facility], [address] [place], [place] [address]    Special Requests   Final    NONE Performed at [facility], [address] [place], [place] [address]    Gram Stain   Final    FEW WBC PRESENT,BOTH PMN AND MONONUCLEAR FEW GRAM POSITIVE COCCI IN CLUSTERS FEW GRAM NEGATIVE RODS    Culture   Final    ABUNDANT SCHAALIA TURICENSIS Standardized susceptibility testing for this organism is not available. Performed at [facility], [address], [place], [place] [address]    Report Status 04/29/2023 FINAL  Final   [x]  Treated with Keflex, organism resistant to prescribed antimicrobial  New antibiotic prescription: Amoxicillin 1 gm TID x 7 days   ED Provider: Dr. Derrill Kay   Comments: Attempted to call patient 12/17 but no answer. Will make two more attempts to get in touch with patient regarding new script.   Littie Deeds, PharmD Pharmacy Resident  04/30/2023 1:06 PM

## 2023-09-13 ENCOUNTER — Other Ambulatory Visit: Payer: Self-pay

## 2023-09-13 ENCOUNTER — Ambulatory Visit: Payer: Self-pay

## 2023-09-13 ENCOUNTER — Emergency Department: Payer: MEDICAID

## 2023-09-13 ENCOUNTER — Emergency Department
Admission: EM | Admit: 2023-09-13 | Discharge: 2023-09-13 | Disposition: A | Discharge status: Other Acute Inpatient Hospital | Payer: MEDICAID | Attending: Emergency Medicine | Admitting: Emergency Medicine

## 2023-09-13 ENCOUNTER — Ambulatory Visit (INDEPENDENT_AMBULATORY_CARE_PROVIDER_SITE_OTHER)
Admission: EM | Admit: 2023-09-13 | Discharge: 2023-09-13 | Disposition: A | Discharge status: Other Acute Inpatient Hospital | Payer: MEDICAID | Source: Home / Self Care

## 2023-09-13 ENCOUNTER — Ambulatory Visit: Payer: MEDICAID | Admitting: Nurse Practitioner

## 2023-09-13 ENCOUNTER — Encounter: Payer: Self-pay | Admitting: Emergency Medicine

## 2023-09-13 DIAGNOSIS — R1084 Generalized abdominal pain: Secondary | ICD-10-CM

## 2023-09-13 DIAGNOSIS — K529 Noninfective gastroenteritis and colitis, unspecified: Secondary | ICD-10-CM | POA: Diagnosis not present

## 2023-09-13 DIAGNOSIS — R1030 Lower abdominal pain, unspecified: Secondary | ICD-10-CM | POA: Diagnosis present

## 2023-09-13 LAB — URINALYSIS, ROUTINE W REFLEX MICROSCOPIC
Bilirubin Urine: NEGATIVE
Glucose, UA: NEGATIVE mg/dL
Ketones, ur: 20 mg/dL — AB
Leukocytes,Ua: NEGATIVE
Nitrite: NEGATIVE
Protein, ur: NEGATIVE mg/dL
Specific Gravity, Urine: 1.021 (ref 1.005–1.030)
pH: 5 (ref 5.0–8.0)

## 2023-09-13 LAB — COMPREHENSIVE METABOLIC PANEL WITH GFR
ALT: 13 U/L (ref 0–44)
AST: 21 U/L (ref 15–41)
Albumin: 4.8 g/dL (ref 3.5–5.0)
Alkaline Phosphatase: 58 U/L (ref 38–126)
Anion gap: 12 (ref 5–15)
BUN: 11 mg/dL (ref 6–20)
CO2: 21 mmol/L — ABNORMAL LOW (ref 22–32)
Calcium: 9.3 mg/dL (ref 8.9–10.3)
Chloride: 99 mmol/L (ref 98–111)
Creatinine, Ser: 0.52 mg/dL (ref 0.44–1.00)
GFR, Estimated: >60 mL/min (ref >60)
Glucose, Bld: 81 mg/dL (ref 70–99)
Potassium: 3.9 mmol/L (ref 3.5–5.1)
Sodium: 132 mmol/L — ABNORMAL LOW (ref 135–145)
Total Bilirubin: 1.3 mg/dL — ABNORMAL HIGH (ref 0.0–1.2)
Total Protein: 7.9 g/dL (ref 6.5–8.1)

## 2023-09-13 LAB — CBC
HCT: 38.8 % (ref 36.0–46.0)
Hemoglobin: 13.5 g/dL (ref 12.0–15.0)
MCH: 33.3 pg (ref 26.0–34.0)
MCHC: 34.8 g/dL (ref 30.0–36.0)
MCV: 95.6 fL (ref 80.0–100.0)
Platelets: 209 10*3/uL (ref 150–400)
RBC: 4.06 MIL/uL (ref 3.87–5.11)
RDW: 11.6 % (ref 11.5–15.5)
WBC: 7.2 10*3/uL (ref 4.0–10.5)
nRBC: 0 % (ref 0.0–0.2)

## 2023-09-13 LAB — POC URINE PREG, ED: Preg Test, Ur: NEGATIVE

## 2023-09-13 LAB — RESP PANEL BY RT-PCR (FLU A&B, COVID) ARPGX2
Influenza A by PCR: NEGATIVE
Influenza B by PCR: NEGATIVE
SARS Coronavirus 2 by RT PCR: NEGATIVE

## 2023-09-13 LAB — LIPASE, BLOOD: Lipase: 24 U/L (ref 11–51)

## 2023-09-13 MED ORDER — METRONIDAZOLE 500 MG PO TABS
500.0000 mg | ORAL_TABLET | Freq: Once | ORAL | Status: AC
  Administered 2023-09-13: 500 mg via ORAL
  Filled 2023-09-13: qty 1

## 2023-09-13 MED ORDER — DICYCLOMINE HCL 10 MG PO CAPS
10.0000 mg | ORAL_CAPSULE | Freq: Once | ORAL | Status: AC
  Administered 2023-09-13: 10 mg via ORAL
  Filled 2023-09-13: qty 1

## 2023-09-13 MED ORDER — LOPERAMIDE HCL 2 MG PO TABS: 2.0000 mg | ORAL_TABLET | Freq: Four times a day (QID) | ORAL | 0 refills | Status: AC | PRN

## 2023-09-13 MED ORDER — DICYCLOMINE HCL 10 MG PO CAPS: 10.0000 mg | ORAL_CAPSULE | Freq: Three times a day (TID) | ORAL | 0 refills | Status: DC

## 2023-09-13 MED ORDER — ONDANSETRON 4 MG PO TBDP: 4.0000 mg | ORAL_TABLET | Freq: Three times a day (TID) | ORAL | 0 refills | Status: DC | PRN

## 2023-09-13 MED ORDER — ONDANSETRON 4 MG PO TBDP
4.0000 mg | ORAL_TABLET | Freq: Once | ORAL | Status: AC
  Administered 2023-09-13: 4 mg via ORAL

## 2023-09-13 MED ORDER — METRONIDAZOLE 500 MG PO TABS: 500.0000 mg | ORAL_TABLET | Freq: Two times a day (BID) | ORAL | 0 refills | Status: DC

## 2023-09-13 MED ORDER — CIPROFLOXACIN HCL 500 MG PO TABS
500.0000 mg | ORAL_TABLET | Freq: Once | ORAL | Status: AC
  Administered 2023-09-13: 500 mg via ORAL
  Filled 2023-09-13: qty 1

## 2023-09-13 MED ORDER — CIPROFLOXACIN HCL 500 MG PO TABS: 500.0000 mg | ORAL_TABLET | Freq: Two times a day (BID) | ORAL | 0 refills | Status: AC

## 2023-09-13 MED ORDER — IOHEXOL 300 MG/ML  SOLN
100.0000 mL | Freq: Once | INTRAMUSCULAR | Status: AC | PRN
  Administered 2023-09-13: 100 mL via INTRAVENOUS

## 2023-09-13 NOTE — Telephone Encounter (Signed)
 1st attempt, per chart patient had appt scheduled for 10:40 this morning with PCP. Voicemail box is full and not accepting any messages. Called [facility] and they state patient "No Show" for appt.  Copied from CRM [id number]. Topic: Clinical - Red Word Triage >> Sep 13, 2023  9:52 AM Rhonda Fox D wrote: Patient has had diarrhea since yesterday ,and having stomach pain. She was screaming and yelling saying her stomach hurts really bad.

## 2023-09-13 NOTE — ED Triage Notes (Signed)
 Started yesterday afternoon Constantly diarrhea Chills, dry mouth, sweating, Nauseous  Today is worse Feels like someone is pushing her lower stomach  Unable to sleep, eat or lay on stomach   Had to [employer] on 4/30 Had a bag of [employer] frozen  banana covered in chocolate as well

## 2023-09-13 NOTE — Discharge Instructions (Addendum)
 Please go to [facility] for evaluation of your abdominal pain.

## 2023-09-13 NOTE — Telephone Encounter (Signed)
 3rd attempt, VM box is full and unable to leave VM.  Copied from CRM [id number]. Topic: Clinical - Red Word Triage >> Sep 13, 2023  9:52 AM [name] wrote: Patient has had diarrhea since yesterday ,and having stomach pain. She was screaming and yelling saying her stomach hurts really bad. Reason for Disposition . Third attempt to contact caller AND no contact made. Phone number verified.  Protocols used: No Contact or Duplicate Contact Call-A-AH

## 2023-09-13 NOTE — ED Notes (Signed)
 Patient is being discharged from the Urgent Care and sent to the Emergency Department via EMS . Per Kent Pear, NP, patient is in need of higher level of care due to Abdominal pain, nausea. Patient is aware and verbalizes understanding of plan of care.  Vitals:   09/13/23 1437  BP: 115/70  Pulse: (!) 108  Resp: 16  Temp: 97.6 F (36.4 C)  SpO2: 100%

## 2023-09-13 NOTE — ED Provider Notes (Signed)
 [facility]    CSN: [id number] Arrival date & time: 09/13/23  1404      History   Chief Complaint Chief Complaint  Patient presents with   Nausea   Abdominal Pain   Diarrhea    HPI Rhonda Fox is a 23 y.o. female.   HPI  23 year old female with past medical history significant for autism, anxiety, myofascial pain syndrome, attention and concentration deficit, and pectus excavatum presents for evaluation of GI symptoms which began yesterday.  She reports chills, dry mouth, sweating, nausea, and frequent diarrhea stools.  She says she has been able to drink water and keep it down.  She denies any runny nose, nasal congestion, sore throat, or ear pain but she has had an infrequent, nonproductive cough.  She denies any sick contacts.  Past Medical History:  Diagnosis Date   Anxiety    Autism    Pectus excavatum     Patient Active Problem List   Diagnosis Date Noted   Myofascial pain syndrome 11/07/2022   Insomnia 11/07/2022   Episodic mood disorder (HCC) 06/06/2022   Attention and concentration deficit 06/06/2022   Depression, recurrent (HCC) 03/16/2022   Other fatigue 03/16/2022    History reviewed. No pertinent surgical history.  OB History     Gravida  0   Para  0   Term  0   Preterm  0   AB  0   Living  0      SAB  0   IAB  0   Ectopic  0   Multiple  0   Live Births  0            Home Medications    Prior to Admission medications   Medication Sig Start Date End Date Taking? Authorizing Provider  amitriptyline  (ELAVIL ) 25 MG tablet Take 1 tablet (25 mg total) by mouth at bedtime. Patient not taking: Reported on 11/08/2022 11/07/22   Cherri Corns C, DO  cephALEXin  (KEFLEX ) 500 MG capsule Take 1 capsule (500 mg total) by mouth 4 (four) times daily. 04/24/23   Ward, Clover Dao, DO  Cholecalciferol (VITAMIN D3) 125 MCG (5000 UT) CAPS Take 1 capsule (5,000 Units total) by mouth daily. Patient not taking: Reported on  11/08/2022 10/22/22   Aileen Alexanders, NP  HYDROcodone -acetaminophen  (NORCO/VICODIN) 5-325 MG tablet Take 2 tablets by mouth every 8 (eight) hours as needed. 04/24/23   Ward, Clover Dao, DO  ibuprofen  (ADVIL ) 600 MG tablet Take 1 tablet (600 mg total) by mouth every 6 (six) hours as needed for moderate pain. 01/30/23   Floydene Hy, PA-C  lidocaine  (XYLOCAINE ) 2 % solution Use as directed 15 mLs in the mouth or throat as needed for mouth pain. 11/08/22   Terre Ferri P, DO  ondansetron  (ZOFRAN -ODT) 4 MG disintegrating tablet Take 1 tablet (4 mg total) by mouth every 6 (six) hours as needed for nausea or vomiting. 04/24/23   Ward, Clover Dao, DO    Family History Family History  Problem Relation Age of Onset   Alcohol abuse Brother    Suicidality Paternal Uncle     Social History Social History   Tobacco Use   Smoking status: Never   Smokeless tobacco: Never  Vaping Use   Vaping status: Never Used  Substance Use Topics   Alcohol use: Not Currently    Comment: Socially   Drug use: Yes    Types: Marijuana     Allergies   Kiwi extract  Review of Systems Review of Systems  Constitutional:  Positive for chills, diaphoresis and fever.  HENT:  Negative for congestion, ear pain, rhinorrhea and sore throat.   Respiratory:  Positive for cough. Negative for shortness of breath and wheezing.   Gastrointestinal:  Positive for abdominal pain, diarrhea and nausea. Negative for vomiting.     Physical Exam Triage Vital Signs ED Triage Vitals  Encounter Vitals Group     BP      Systolic BP Percentile      Diastolic BP Percentile      Pulse      Resp      Temp      Temp src      SpO2      Weight      Height      Head Circumference      Peak Flow      Pain Score      Pain Loc      Pain Education      Exclude from Growth Chart    No data found.  Updated Vital Signs BP 115/70 (BP Location: Right Arm)   Pulse (!) 108   Temp 97.6 F (36.4 C) (Oral)   Resp 16   LMP  08/13/2023 (Approximate)   SpO2 100%   Visual Acuity Right Eye Distance:   Left Eye Distance:   Bilateral Distance:    Right Eye Near:   Left Eye Near:    Bilateral Near:     Physical Exam Vitals and nursing note reviewed.  Constitutional:      Appearance: Normal appearance. She is ill-appearing.  HENT:     Head: Normocephalic and atraumatic.  Cardiovascular:     Rate and Rhythm: Normal rate and regular rhythm.     Pulses: Normal pulses.     Heart sounds: Normal heart sounds. No murmur heard.    No friction rub. No gallop.  Pulmonary:     Effort: Pulmonary effort is normal.     Breath sounds: Normal breath sounds. No wheezing, rhonchi or rales.  Abdominal:     General: Abdomen is flat.     Palpations: Abdomen is soft.     Tenderness: There is generalized abdominal tenderness. There is guarding. There is no rebound.     Comments: Diffuse abdominal tenderness but no guarding or rebound.  Skin:    General: Skin is warm and dry.     Capillary Refill: Capillary refill takes less than 2 seconds.     Findings: No rash.  Neurological:     General: No focal deficit present.     Mental Status: She is alert and oriented to person, place, and time.      UC Treatments / Results  Labs (all labs ordered are listed, but only abnormal results are displayed) Labs Reviewed  RESP PANEL BY RT-PCR (FLU A&B, COVID) [id number]    EKG   Radiology No results found.  Procedures Procedures (including critical care time)  Medications Ordered in UC Medications  ondansetron  (ZOFRAN -ODT) disintegrating tablet 4 mg (4 mg Oral Given 09/13/23 1450)    Initial Impression / Assessment and Plan / UC Course  I have reviewed the triage vital signs and the nursing notes.  Pertinent labs & imaging results that were available during my care of the patient were reviewed by me and considered in my medical decision making (see chart for details).   Patient is a pleasant, though ill-appearing,  23 year old female presenting for evaluation of GI complaints  as outlined in HPI above.  She also endorses an infrequent, nonproductive cough but denies any other upper or lower respiratory symptoms.  She is able to speak in full sentence with dyspnea or tachypnea.  Lungs are clear to auscultation all fields.  Her abdomen is soft and flat but diffusely tender.  Patient does have guarding but no rebound.  She reports that she has been able to drink fluids but it does increase her nausea.  She has had approximately 15 episodes of diarrhea stool but has not seen any blood in her stool.  She denies any urinary symptoms.  She reports that her pain is more in her lower abdomen.  Differential diagnosis include COVID, influenza, gastroenteritis.  I will order a COVID and flu PCR.  I will also order 4 mg of Zofran  administered ODT followed by a p.o. fluid challenge approximately 20 minutes after Zofran .  Respiratory panel is negative for COVID and influenza.  The patient reports that her abdominal pain is worsening and she feels as if the Zofran  has not helped her with her nausea.  She is sipping on clear fluids and keeping them down.  Due to the fact that she has worsening abdominal pain I do feel she needs to be evaluated in the emergency department.  She is going to contact her mother to see if she can transport her to [facility].  The patient's mother is not in the area and is unable to transport her to [facility].  We will call EMS for transport to [facility].  Report given to paramedics from [facility].  Care transferred.   Final Clinical Impressions(s) / UC Diagnoses   Final diagnoses:  Generalized abdominal pain     Discharge Instructions      Please go to [facility] for evaluation of your abdominal pain.     ED Prescriptions   None    PDMP not reviewed this encounter.   Kent Pear, NP 09/13/23 [phone]

## 2023-09-13 NOTE — Telephone Encounter (Signed)
 Patient transported to ED per UC recommendation.

## 2023-09-13 NOTE — ED Provider Notes (Signed)
 [facility] Provider Note  Patient Contact: 10:05 PM (approximate)   History   Abdominal Pain   HPI  Rhonda Fox is a 23 y.o. female who presents to the emergency department complaining of lower abdominal pain and diarrhea.  Patient states that this began yesterday.  Patient has had some nausea but no vomiting.  She denies any blood in the stool.  Denies any urinary changes.  She denies any chance of pregnancy.     Physical Exam   Triage Vital Signs: ED Triage Vitals  Encounter Vitals Group     BP 09/13/23 1701 105/62     Systolic BP Percentile --      Diastolic BP Percentile --      Pulse Rate 09/13/23 1701 100     Resp 09/13/23 1701 17     Temp 09/13/23 1701 98 F (36.7 C)     Temp Source 09/13/23 1701 Oral     SpO2 09/13/23 1701 100 %     Weight 09/13/23 1703 110 lb (49.9 kg)     Height --      Head Circumference --      Peak Flow --      Pain Score 09/13/23 1702 10     Pain Loc --      Pain Education --      Exclude from Growth Chart --     Most recent vital signs: Vitals:   09/13/23 1701  BP: 105/62  Pulse: 100  Resp: 17  Temp: 98 F (36.7 C)  SpO2: 100%     General: Alert and in no acute distress.  Cardiovascular:  Good peripheral perfusion Respiratory: Normal respiratory effort without tachypnea or retractions. Lungs CTAB.  Gastrointestinal: Bowel sounds 4 quadrants.  Diffuse abdominal tenderness to palpation worse in the bilateral lower quadrants.. No guarding or rigidity. No palpable masses. No distention. No CVA tenderness. Musculoskeletal: Full range of motion to all extremities.  Neurologic:  No gross focal neurologic deficits are appreciated.  Skin:   No rash noted Other:   ED Results / Procedures / Treatments   Labs (all labs ordered are listed, but only abnormal results are displayed) Labs Reviewed  COMPREHENSIVE METABOLIC PANEL WITH GFR - Abnormal; Notable for the following components:      Result  Value   Sodium 132 (*)    CO2 21 (*)    Total Bilirubin 1.3 (*)    All other components within normal limits  URINALYSIS, ROUTINE W REFLEX MICROSCOPIC - Abnormal; Notable for the following components:   Color, Urine YELLOW (*)    APPearance HAZY (*)    Hgb urine dipstick MODERATE (*)    Ketones, ur 20 (*)    Bacteria, UA RARE (*)    All other components within normal limits  LIPASE, BLOOD  CBC  POC URINE PREG, ED     EKG     RADIOLOGY  I personally viewed, evaluated, and interpreted these images as part of my medical decision making, as well as reviewing the written report by the radiologist.  ED Provider Interpretation: CT scan reveals mild colitis in the right colon, right ovarian cyst.  CT ABDOMEN PELVIS W CONTRAST Result Date: 09/13/2023 CLINICAL DATA:  Increasing abdominal pain EXAM: CT ABDOMEN AND PELVIS WITH CONTRAST TECHNIQUE: Multidetector CT imaging of the abdomen and pelvis was performed using the standard protocol following bolus administration of intravenous contrast. RADIATION DOSE REDUCTION: This exam was performed according to the departmental dose-optimization program which  includes automated exposure control, adjustment of the mA and/or kV according to patient size and/or use of iterative reconstruction technique. CONTRAST:  100mL OMNIPAQUE IOHEXOL 300 MG/ML  SOLN COMPARISON:  None FINDINGS: Lower chest: No acute abnormality. Hepatobiliary: No focal liver abnormality is seen. No gallstones, gallbladder wall thickening, or biliary dilatation. Pancreas: Unremarkable. No pancreatic ductal dilatation or surrounding inflammatory changes. Spleen: Normal in size without focal abnormality. Adrenals/Urinary Tract: Adrenal glands are within normal limits. Kidneys are well visualized with a normal enhancement pattern. No renal calculi or obstructive changes are noted. Bladder is decompressed. Stomach/Bowel: The appendix is within normal limits. Mild edema is noted within the  ascending colon likely related to a mild degree of colitis. No abscess or perforation is noted. The remainder of the colon is within normal limits. Small bowel and stomach are unremarkable. Vascular/Lymphatic: No significant vascular findings are present. No enlarged abdominal or pelvic lymph nodes. Reproductive: Uterus is within normal limits. Involuting cyst is noted on right. Other: No abdominal wall hernia or abnormality. No abdominopelvic ascites. Musculoskeletal: No acute or significant osseous findings. IMPRESSION: Changes consistent with mild colitis within the right colon. Involuting right ovarian cyst. No other focal abnormality is noted. Electronically Signed   By: Violeta Grey M.D.   On: 09/13/2023 22:02    PROCEDURES:  Critical Care performed: No  Procedures   MEDICATIONS ORDERED IN ED: Medications  metroNIDAZOLE  (FLAGYL ) tablet 500 mg (has no administration in time range)  ciprofloxacin  (CIPRO ) tablet 500 mg (has no administration in time range)  dicyclomine  (BENTYL ) capsule 10 mg (has no administration in time range)  iohexol (OMNIPAQUE) 300 MG/ML solution 100 mL (100 mLs Intravenous Contrast Given 09/13/23 2141)     IMPRESSION / MDM / ASSESSMENT AND PLAN / ED COURSE  I reviewed the triage vital signs and the nursing notes.                                 Differential diagnosis includes, but is not limited to, appendicitis, cholecystitis, ovarian cyst, gastroenteritis, colitis   Patient's presentation is most consistent with acute presentation with potential threat to life or bodily function.   Patient's diagnosis is consistent with colitis.  Patient presents to the emergency department with bilateral lower abdominal pain.  Patient states that symptoms have been accompanied by diarrhea.  She was diffusely tender through the abdomen worse in the bilateral lower quadrants.  Patient's labs were reassuring but given the reported tenderness imaging was obtained.  Patient has  findings consistent with colitis and resolving ovarian cyst.  Patient will be treated with Flagyl  and Cipro .  Symptom control medicines of Zofran  and Bentyl  will be prescribed.  If patient has worsening diarrhea especially after the antibiotics she may even take loperamide..  Patient is given ED precautions to return to the ED for any worsening or new symptoms.     FINAL CLINICAL IMPRESSION(S) / ED DIAGNOSES   Final diagnoses:  Colitis     Rx / DC Orders   ED Discharge Orders          Ordered    ciprofloxacin  (CIPRO ) 500 MG tablet  2 times daily        09/13/23 2218    metroNIDAZOLE  (FLAGYL ) 500 MG tablet  2 times daily        09/13/23 2218    loperamide (IMODIUM A-D) 2 MG tablet  4 times daily PRN  09/13/23 2218    ondansetron  (ZOFRAN -ODT) 4 MG disintegrating tablet  Every 8 hours PRN        09/13/23 2218    dicyclomine  (BENTYL ) 10 MG capsule  3 times daily before meals & bedtime        09/13/23 2218             Note:  This document was prepared using Dragon voice recognition software and may include unintentional dictation errors.   Annia Kilts 09/13/23 2219    Jacquie Maudlin, MD 09/14/23 2006

## 2023-09-13 NOTE — ED Triage Notes (Signed)
 Pt arrived via EMS from [facility] for increased abd pain. Pt sts that she has been having abd pain with N/V/D for the last three days. Pt endorses that she smoked marijuana three days ago also.

## 2023-09-13 NOTE — Telephone Encounter (Signed)
 2nd attempt, no answer and VM box is full.  Copied from CRM [id number]. Topic: Clinical - Red Word Triage >> Sep 13, 2023  9:52 AM [name] wrote: Patient has had diarrhea since yesterday ,and having stomach pain. She was screaming and yelling saying her stomach hurts really bad.

## 2023-09-13 NOTE — ED Notes (Signed)
 Pt called to desk to check on status

## 2023-09-23 ENCOUNTER — Emergency Department
Admission: EM | Admit: 2023-09-23 | Discharge: 2023-09-23 | Disposition: A | Discharge status: Home / Self Care | Payer: MEDICAID | Attending: Emergency Medicine | Admitting: Emergency Medicine

## 2023-09-23 ENCOUNTER — Other Ambulatory Visit: Payer: Self-pay

## 2023-09-23 DIAGNOSIS — R1084 Generalized abdominal pain: Secondary | ICD-10-CM | POA: Insufficient documentation

## 2023-09-23 DIAGNOSIS — R112 Nausea with vomiting, unspecified: Secondary | ICD-10-CM | POA: Diagnosis not present

## 2023-09-23 LAB — COMPREHENSIVE METABOLIC PANEL WITH GFR
ALT: 25 U/L (ref 0–44)
AST: 23 U/L (ref 15–41)
Albumin: 4.5 g/dL (ref 3.5–5.0)
Alkaline Phosphatase: 46 U/L (ref 38–126)
Anion gap: 6 (ref 5–15)
BUN: 24 mg/dL — ABNORMAL HIGH (ref 6–20)
CO2: 26 mmol/L (ref 22–32)
Calcium: 9.2 mg/dL (ref 8.9–10.3)
Chloride: 107 mmol/L (ref 98–111)
Creatinine, Ser: 2.13 mg/dL — ABNORMAL HIGH (ref 0.44–1.00)
GFR, Estimated: 33 mL/min — ABNORMAL LOW (ref >60)
Glucose, Bld: 104 mg/dL — ABNORMAL HIGH (ref 70–99)
Potassium: 3.8 mmol/L (ref 3.5–5.1)
Sodium: 139 mmol/L (ref 135–145)
Total Bilirubin: 0.6 mg/dL (ref 0.0–1.2)
Total Protein: 7.4 g/dL (ref 6.5–8.1)

## 2023-09-23 LAB — URINALYSIS, ROUTINE W REFLEX MICROSCOPIC
Bilirubin Urine: NEGATIVE
Glucose, UA: NEGATIVE mg/dL
Ketones, ur: NEGATIVE mg/dL
Leukocytes,Ua: NEGATIVE
Nitrite: NEGATIVE
Protein, ur: NEGATIVE mg/dL
Specific Gravity, Urine: 1.011 (ref 1.005–1.030)
pH: 5 (ref 5.0–8.0)

## 2023-09-23 LAB — CBC
HCT: 39.5 % (ref 36.0–46.0)
Hemoglobin: 13.3 g/dL (ref 12.0–15.0)
MCH: 32.4 pg (ref 26.0–34.0)
MCHC: 33.7 g/dL (ref 30.0–36.0)
MCV: 96.3 fL (ref 80.0–100.0)
Platelets: 264 10*3/uL (ref 150–400)
RBC: 4.1 MIL/uL (ref 3.87–5.11)
RDW: 11.6 % (ref 11.5–15.5)
WBC: 10.2 10*3/uL (ref 4.0–10.5)
nRBC: 0 % (ref 0.0–0.2)

## 2023-09-23 LAB — LIPASE, BLOOD: Lipase: 35 U/L (ref 11–51)

## 2023-09-23 LAB — POC URINE PREG, ED: Preg Test, Ur: NEGATIVE

## 2023-09-23 MED ORDER — DICYCLOMINE HCL 10 MG PO CAPS: 10.0000 mg | ORAL_CAPSULE | Freq: Three times a day (TID) | ORAL | 0 refills | Status: DC

## 2023-09-23 MED ORDER — DICYCLOMINE HCL 10 MG/ML IM SOLN
10.0000 mg | Freq: Once | INTRAMUSCULAR | Status: AC
Start: 2023-09-23 — End: 2023-09-23
  Administered 2023-09-23: 10 mg via INTRAMUSCULAR
  Filled 2023-09-23: qty 2

## 2023-09-23 MED ORDER — SODIUM CHLORIDE 0.9 % IV BOLUS
1000.0000 mL | Freq: Once | INTRAVENOUS | Status: AC
  Administered 2023-09-23: 1000 mL via INTRAVENOUS

## 2023-09-23 MED ORDER — METRONIDAZOLE 500 MG PO TABS: 500.0000 mg | ORAL_TABLET | Freq: Two times a day (BID) | ORAL | 0 refills | Status: DC

## 2023-09-23 MED ORDER — CIPROFLOXACIN HCL 500 MG PO TABS: 500.0000 mg | ORAL_TABLET | Freq: Two times a day (BID) | ORAL | 0 refills | Status: AC

## 2023-09-23 MED ORDER — METOCLOPRAMIDE HCL 5 MG/ML IJ SOLN
10.0000 mg | Freq: Once | INTRAMUSCULAR | Status: AC
  Administered 2023-09-23: 10 mg via INTRAVENOUS
  Filled 2023-09-23: qty 2

## 2023-09-23 MED ORDER — ONDANSETRON HCL 4 MG/2ML IJ SOLN
4.0000 mg | Freq: Once | INTRAMUSCULAR | Status: DC
  Filled 2023-09-23: qty 2

## 2023-09-23 NOTE — Discharge Instructions (Addendum)
 Call today make an appointment with Dr. Baldomero Bone who is on-call for gastroenteritis.  It may be several weeks before they have an appointment open.  Prescriptions for the same medication that you were prescribed when you were in the emergency department was sent to the pharmacy.  Continue to drink fluids frequently to stay hydrated.  Return to the emergency department if any severe worsening of your symptoms or urgent concerns.  Avoid fried foods, spicy foods and foods that contain a lot of fat which may increase your stomach upset.

## 2023-09-23 NOTE — ED Triage Notes (Signed)
 Pt comes with belly pain with nausea and vomiting. Pt states hx of gallbladder issues. Pt hasn't been able to keep anything down.

## 2023-09-23 NOTE — ED Provider Notes (Signed)
 [facility] Provider Note    Event Date/Time   First MD Initiated Contact with Patient 09/23/23 1129     (approximate)   History   Abdominal Pain   HPI  Rhonda Fox is a 23 y.o. female   presents to the ED with complaint of abdominal pain with nausea and vomiting.  Patient was seen in the emergency department on 09/13/2023 at which time she was told that she had colitis and was treated with antibiotics along with Bentyl .  Patient states that she took these medications until Saturday and was doing much better until she ran out of medication.  It was after that she began having same symptoms.  She denies any fever, chills or diarrhea at this time.  She does endorse vomiting.  Patient has history of depression, myofascial pain syndrome, fatigue, episodic mood disorder, attention deficit and insomnia.      Physical Exam   Triage Vital Signs: ED Triage Vitals  Encounter Vitals Group     BP 09/23/23 1047 123/86     Systolic BP Percentile --      Diastolic BP Percentile --      Pulse Rate 09/23/23 1047 (!) 110     Resp 09/23/23 1047 19     Temp 09/23/23 1047 98 F (36.7 C)     Temp Source 09/23/23 1047 Oral     SpO2 09/23/23 1047 97 %     Weight 09/23/23 1050 110 lb (49.9 kg)     Height 09/23/23 1050 5' (1.524 m)     Head Circumference --      Peak Flow --      Pain Score 09/23/23 1050 10     Pain Loc --      Pain Education --      Exclude from Growth Chart --     Most recent vital signs: Vitals:   09/23/23 1047  BP: 123/86  Pulse: (!) 110  Resp: 19  Temp: 98 F (36.7 C)  SpO2: 97%     General: Awake, no distress.  Alert.  Able to answer questions appropriately. CV:  Good peripheral perfusion.  Heart regular rate and rhythm. Resp:  Normal effort.  Lungs clear bilaterally. Abd:  No distention.  Mild diffuse tenderness noted throughout.  Bowel sounds normoactive x 4 quadrants.  No rebound or right lower quadrant  tenderness. Other:     ED Results / Procedures / Treatments   Labs (all labs ordered are listed, but only abnormal results are displayed) Labs Reviewed  COMPREHENSIVE METABOLIC PANEL WITH GFR - Abnormal; Notable for the following components:      Result Value   Glucose, Bld 104 (*)    BUN 24 (*)    Creatinine, Ser 2.13 (*)    GFR, Estimated 33 (*)    All other components within normal limits  URINALYSIS, ROUTINE W REFLEX MICROSCOPIC - Abnormal; Notable for the following components:   Color, Urine YELLOW (*)    APPearance CLEAR (*)    Hgb urine dipstick MODERATE (*)    Bacteria, UA RARE (*)    All other components within normal limits  LIPASE, BLOOD  CBC  POC URINE PREG, ED    PROCEDURES:  Critical Care performed:   Procedures   MEDICATIONS ORDERED IN ED: Medications  ondansetron  (ZOFRAN ) injection 4 mg (4 mg Intravenous Patient Refused/Not Given 09/23/23 1200)  sodium chloride 0.9 % bolus 1,000 mL (0 mLs Intravenous Stopping previously hung infusion 09/23/23 1252)  metoCLOPramide (REGLAN) injection 10 mg (10 mg Intravenous Given 09/23/23 1209)  dicyclomine  (BENTYL ) injection 10 mg (10 mg Intramuscular Given 09/23/23 1251)     IMPRESSION / MDM / ASSESSMENT AND PLAN / ED COURSE  I reviewed the triage vital signs and the nursing notes.   Differential diagnosis includes, but is not limited to, abdominal pain, urinary tract infection, urolithiasis, acute exacerbation of colitis, cholecystitis,  ----------------------------------------- 1:42 PM on 09/23/2023 ----------------------------------------- No continued vomiting was noted after medications.  23 year old female presents to the ED with complaint of developed abdominal pain with nausea and vomiting.  Patient was seen in the emergency department on 09/23/2023 for the same symptoms and was doing well on her medication until she ran out Saturday.  IV fluids were started and patient refused continue the entire liter of  normal saline as she states it was uncomfortable.  She also refused any other IV medications.  I spoke to her for the reasons for the IV hydration and her nausea.  After the IV Reglan was given patient had no further nausea or vomiting.  She began to have decreased abdominal cramping.  We discussed calling and following up with the gastroenterologist for further evaluation and treatment.  Will place her back on her medications including the Bentyl , Flagyl , Cipro  and patient states that she has Zofran  at home if needed for nausea.  Encouraged her to drink fluids frequently.  She was told to return to the emergency department if any severe worsening of her symptoms or urgent concerns.  I did review her CT abdomen pelvis with contrast and per radiologist saw mild colitis involving the right colon.  Contact information for Dr. Vanga was given to the patient with instructions to call today to make an appointment as it may be several weeks before she can be seen.    FINAL CLINICAL IMPRESSION(S) / ED DIAGNOSES   Final diagnoses:  Generalized abdominal pain     Rx / DC Orders   ED Discharge Orders          Ordered    dicyclomine  (BENTYL ) 10 MG capsule  3 times daily before meals & bedtime        09/23/23 1311    metroNIDAZOLE  (FLAGYL ) 500 MG tablet  2 times daily        09/23/23 1311    ciprofloxacin  (CIPRO ) 500 MG tablet  2 times daily        09/23/23 1311             Note:  This document was prepared using Dragon voice recognition software and may include unintentional dictation errors.   Stafford Eagles, PA-C 09/23/23 1407    Bryson Carbine, MD 09/23/23 1500

## 2023-11-03 ENCOUNTER — Other Ambulatory Visit: Payer: Self-pay

## 2023-11-03 ENCOUNTER — Emergency Department
Admission: EM | Admit: 2023-11-03 | Discharge: 2023-11-03 | Disposition: A | Discharge status: Home / Self Care | Payer: MEDICAID | Attending: Emergency Medicine | Admitting: Emergency Medicine

## 2023-11-03 DIAGNOSIS — R1084 Generalized abdominal pain: Secondary | ICD-10-CM | POA: Insufficient documentation

## 2023-11-03 LAB — URINALYSIS, ROUTINE W REFLEX MICROSCOPIC
Bilirubin Urine: NEGATIVE
Glucose, UA: NEGATIVE mg/dL
Hgb urine dipstick: NEGATIVE
Ketones, ur: NEGATIVE mg/dL
Leukocytes,Ua: NEGATIVE
Nitrite: NEGATIVE
Protein, ur: NEGATIVE mg/dL
Specific Gravity, Urine: 1.028 (ref 1.005–1.030)
pH: 5 (ref 5.0–8.0)

## 2023-11-03 LAB — URINE DRUG SCREEN, QUALITATIVE (ARMC ONLY)
Amphetamines, Ur Screen: NOT DETECTED
Barbiturates, Ur Screen: NOT DETECTED
Benzodiazepine, Ur Scrn: NOT DETECTED
Cannabinoid 50 Ng, Ur ~~LOC~~: NOT DETECTED
Cocaine Metabolite,Ur ~~LOC~~: NOT DETECTED
MDMA (Ecstasy)Ur Screen: NOT DETECTED
Methadone Scn, Ur: NOT DETECTED
Opiate, Ur Screen: NOT DETECTED
Phencyclidine (PCP) Ur S: NOT DETECTED
Tricyclic, Ur Screen: NOT DETECTED

## 2023-11-03 LAB — COMPREHENSIVE METABOLIC PANEL WITH GFR
ALT: 14 U/L (ref 0–44)
AST: 20 U/L (ref 15–41)
Albumin: 4.6 g/dL (ref 3.5–5.0)
Alkaline Phosphatase: 48 U/L (ref 38–126)
Anion gap: 10 (ref 5–15)
BUN: 17 mg/dL (ref 6–20)
CO2: 23 mmol/L (ref 22–32)
Calcium: 9.2 mg/dL (ref 8.9–10.3)
Chloride: 104 mmol/L (ref 98–111)
Creatinine, Ser: 0.6 mg/dL (ref 0.44–1.00)
GFR, Estimated: >60 mL/min (ref >60)
Glucose, Bld: 92 mg/dL (ref 70–99)
Potassium: 4.1 mmol/L (ref 3.5–5.1)
Sodium: 137 mmol/L (ref 135–145)
Total Bilirubin: 1.1 mg/dL (ref 0.0–1.2)
Total Protein: 7.5 g/dL (ref 6.5–8.1)

## 2023-11-03 LAB — CBC
HCT: 37.8 % (ref 36.0–46.0)
Hemoglobin: 13.1 g/dL (ref 12.0–15.0)
MCH: 33.3 pg (ref 26.0–34.0)
MCHC: 34.7 g/dL (ref 30.0–36.0)
MCV: 96.2 fL (ref 80.0–100.0)
Platelets: 230 10*3/uL (ref 150–400)
RBC: 3.93 MIL/uL (ref 3.87–5.11)
RDW: 11.8 % (ref 11.5–15.5)
WBC: 5.1 10*3/uL (ref 4.0–10.5)
nRBC: 0 % (ref 0.0–0.2)

## 2023-11-03 LAB — POC URINE PREG, ED: Preg Test, Ur: NEGATIVE

## 2023-11-03 LAB — LIPASE, BLOOD: Lipase: 26 U/L (ref 11–51)

## 2023-11-03 MED ORDER — ONDANSETRON 4 MG PO TBDP
4.0000 mg | ORAL_TABLET | Freq: Once | ORAL | Status: AC
  Administered 2023-11-03: 4 mg via ORAL
  Filled 2023-11-03: qty 1

## 2023-11-03 MED ORDER — DICYCLOMINE HCL 10 MG PO CAPS
10.0000 mg | ORAL_CAPSULE | Freq: Once | ORAL | Status: AC
  Administered 2023-11-03: 10 mg via ORAL
  Filled 2023-11-03: qty 1

## 2023-11-03 MED ORDER — ONDANSETRON 4 MG PO TBDP
4.0000 mg | ORAL_TABLET | Freq: Three times a day (TID) | ORAL | 0 refills | Status: DC | PRN
Start: 2023-11-03 — End: 2024-01-26

## 2023-11-03 MED ORDER — DICYCLOMINE HCL 10 MG PO CAPS: 10.0000 mg | ORAL_CAPSULE | Freq: Three times a day (TID) | ORAL | 0 refills | Status: AC

## 2023-11-03 MED ORDER — ACETAMINOPHEN 500 MG PO TABS
1000.0000 mg | ORAL_TABLET | Freq: Once | ORAL | Status: AC
  Administered 2023-11-03: 1000 mg via ORAL
  Filled 2023-11-03: qty 2

## 2023-11-03 NOTE — ED Triage Notes (Signed)
 Pt sts that she has been having abd pain from here colitis. Pt sts that she has been going on for months. Pt sts that she is also constipated.

## 2023-11-03 NOTE — ED Provider Notes (Signed)
 [facility] Provider Note    Event Date/Time   First MD Initiated Contact with Patient 11/03/23 1547     (approximate)   History   Abdominal Pain   HPI  Rhonda Fox is a 23 y.o. female no significant past medical history presents to the emergency department with abdominal pain.  States that she has had intermittent episodes of abdominal pain over the past couple of months.  States that she gets lower abdominal cramping.  States that it has been associated with intermittent episodes of diarrhea but then has also had constipation.  Denies any blood in her stool.  Mild nausea but no episodes of vomiting.  No fever or chills.  Denies dysuria, urinary urgency or frequency.  Denies significant concern for pregnancy.  Last menstrual cycle was 2 weeks ago.  Sexually active.  Denies concern for an STI.  Denies any lower abdominal pain at this time.  Denies family history of IBD.  No known tick bites or tick exposure.  No rashes.  Denies alcohol use.  Denies marijuana use.  States that she had to leave work today because of abdominal cramping.     Physical Exam   Triage Vital Signs: ED Triage Vitals [11/03/23 1527]  Encounter Vitals Group     BP 125/81     Girls Systolic BP Percentile      Girls Diastolic BP Percentile      Boys Systolic BP Percentile      Boys Diastolic BP Percentile      Pulse Rate 67     Resp 17     Temp 98.4 F (36.9 C)     Temp Source Oral     SpO2 100 %     Weight 105 lb (47.6 kg)     Height 5' (1.524 m)     Head Circumference      Peak Flow      Pain Score 10     Pain Loc      Pain Education      Exclude from Growth Chart     Most recent vital signs: Vitals:   11/03/23 1527  BP: 125/81  Pulse: 67  Resp: 17  Temp: 98.4 F (36.9 C)  SpO2: 100%    Physical Exam Constitutional:      Appearance: She is well-developed.  HENT:     Head: Atraumatic.   Eyes:     Conjunctiva/sclera: Conjunctivae normal.     Cardiovascular:     Rate and Rhythm: Regular rhythm.  Pulmonary:     Effort: No respiratory distress.  Abdominal:     General: There is no distension.     Tenderness: There is no abdominal tenderness. There is no right CVA tenderness, left CVA tenderness, guarding or rebound. Negative signs include Murphy's sign and McBurney's sign.   Musculoskeletal:        General: Normal range of motion.     Cervical back: Normal range of motion.   Skin:    General: Skin is warm.   Neurological:     Mental Status: She is alert. Mental status is at baseline.     IMPRESSION / MDM / ASSESSMENT AND PLAN / ED COURSE  I reviewed the triage vital signs and the nursing notes.  Differential diagnosis including dehydration, viral illness, IBS, mittelschmerz.   Plan for lab work LABS (all labs ordered are listed, but only abnormal results are displayed) Labs interpreted as -    Labs Reviewed  URINALYSIS, ROUTINE W REFLEX MICROSCOPIC - Abnormal; Notable for the following components:      Result Value   Color, Urine YELLOW (*)    APPearance HAZY (*)    All other components within normal limits  LIPASE, BLOOD  COMPREHENSIVE METABOLIC PANEL WITH GFR  CBC  URINE DRUG SCREEN, QUALITATIVE ([facility] ONLY)  POC URINE PREG, ED     MDM  Pregnancy test is negative.  No findings concerning for urinary tract infection.  Urine drug screen negative.  No significant leukocytosis.  Creatinine at baseline.  No significant electrolyte abnormality.  Normal lipase.  On chart review patient had a CT scan done within the past couple of months that showed some mild findings of colitis on the right side.  Patient was given Zofran , Tylenol  and Bentyl   On reevaluation states she is feeling much better.  Does not have any abdominal pain at this time.  Repeat abdominal exam is nontender to palpation.  I have a low suspicion for ovarian torsion or ovarian cyst.  Possible IBS.  Discussed symptomatic treatment.  Given a  referral for primary care follow-up.  Given information to follow-up with gastroenterology.  Discussed return precautions to the emergency department for any worsening symptoms.     PROCEDURES:  Critical Care performed: No  Procedures  Patient's presentation is most consistent with acute complicated illness / injury requiring diagnostic workup.   MEDICATIONS ORDERED IN ED: Medications  ondansetron  (ZOFRAN -ODT) disintegrating tablet 4 mg (4 mg Oral Given 11/03/23 1653)  acetaminophen  (TYLENOL ) tablet 1,000 mg (1,000 mg Oral Given 11/03/23 1652)  dicyclomine  (BENTYL ) capsule 10 mg (10 mg Oral Given 11/03/23 1653)    FINAL CLINICAL IMPRESSION(S) / ED DIAGNOSES   Final diagnoses:  Generalized abdominal pain     Rx / DC Orders   ED Discharge Orders          Ordered    ondansetron  (ZOFRAN -ODT) 4 MG disintegrating tablet  Every 8 hours PRN        11/03/23 1753    dicyclomine  (BENTYL ) 10 MG capsule  3 times daily before meals & bedtime        11/03/23 1753    Ambulatory Referral to Primary Care (Establish Care)        11/03/23 1754             Note:  This document was prepared using Dragon voice recognition software and may include unintentional dictation errors.   Suzanne Kirsch, MD 11/03/23 [phone]

## 2023-11-03 NOTE — ED Notes (Signed)
 Pt called out using call bell, reports she is dying and feels like something is wrong. Pt holding her lower abd stating it feels like something is stabbing her. Mumma, MD notified.

## 2023-11-03 NOTE — Discharge Instructions (Signed)
 You were seen in the emergency department for abdominal pain.  Your lab work was reassuring.  You are given nausea medication, Tylenol  and Bentyl  in the emergency department.  You are given information to follow-up with a primary care physician.  You are also given information to follow-up with gastroenterology.  Return to the emergency department for any worsening symptoms.  Stay hydrated and drink plenty of fluids.  You can alternate Tylenol  and Motrin  for pain control.  Pain control:  Ibuprofen  (motrin /aleve /advil ) - You can take 3 tablets (600 mg) every 6 hours as needed for pain/fever.  Acetaminophen  (tylenol ) - You can take 2 extra strength tablets (1000 mg) every 6 hours as needed for pain/fever.  You can alternate these medications or take them together.  Make sure you eat food/drink water when taking these medications.  zofran  (ondansetron ) - nausea medication, take 1 tablet every 8 hours as needed for nausea/vomiting.  Do not take Bentyl  if you are constipated

## 2023-12-08 ENCOUNTER — Ambulatory Visit: Payer: MEDICAID

## 2024-01-26 ENCOUNTER — Encounter: Payer: Self-pay | Admitting: Emergency Medicine

## 2024-01-26 ENCOUNTER — Ambulatory Visit
Admission: EM | Admit: 2024-01-26 | Discharge: 2024-01-26 | Disposition: A | Discharge status: Home / Self Care | Payer: MEDICAID | Attending: Emergency Medicine | Admitting: Emergency Medicine

## 2024-01-26 DIAGNOSIS — N12 Tubulo-interstitial nephritis, not specified as acute or chronic: Secondary | ICD-10-CM | POA: Diagnosis present

## 2024-01-26 DIAGNOSIS — Z20822 Contact with and (suspected) exposure to covid-19: Secondary | ICD-10-CM | POA: Diagnosis present

## 2024-01-26 DIAGNOSIS — R197 Diarrhea, unspecified: Secondary | ICD-10-CM | POA: Diagnosis present

## 2024-01-26 LAB — URINALYSIS, W/ REFLEX TO CULTURE (INFECTION SUSPECTED)
Glucose, UA: NEGATIVE mg/dL
Ketones, ur: 80 mg/dL — AB
Nitrite: NEGATIVE
Protein, ur: NEGATIVE mg/dL
Specific Gravity, Urine: 1.025 (ref 1.005–1.030)
pH: 5.5 (ref 5.0–8.0)

## 2024-01-26 LAB — RESP PANEL BY RT-PCR (FLU A&B, COVID) ARPGX2
Influenza A by PCR: NEGATIVE
Influenza B by PCR: NEGATIVE
SARS Coronavirus 2 by RT PCR: NEGATIVE

## 2024-01-26 LAB — GROUP A STREP BY PCR: Group A Strep by PCR: NOT DETECTED

## 2024-01-26 MED ORDER — ONDANSETRON 4 MG PO TBDP: 4.0000 mg | ORAL_TABLET | Freq: Three times a day (TID) | ORAL | 0 refills | Status: AC | PRN

## 2024-01-26 MED ORDER — CEFTRIAXONE SODIUM 1 G IJ SOLR
1.0000 g | Freq: Once | INTRAMUSCULAR | Status: AC
  Administered 2024-01-26: 1 g via INTRAMUSCULAR

## 2024-01-26 MED ORDER — CEFDINIR 300 MG PO CAPS: 300.0000 mg | ORAL_CAPSULE | Freq: Two times a day (BID) | ORAL | 0 refills | Status: AC

## 2024-01-26 NOTE — ED Triage Notes (Addendum)
 Patient c/o bodyaches, headache, sore throat, fever, diarrhea and stomach cramps that started on Friday.  Patient also reports urinary frequency that started on Friday too.

## 2024-01-26 NOTE — ED Notes (Signed)
 Patient scanned barcode in exam room.

## 2024-01-26 NOTE — Discharge Instructions (Addendum)
 Zofran  will help with nausea and slow down the diarrhea.  Tylenol  1000 mg 3-4 times a day as needed for pain.  No ibuprofen  given your history of ulcerative colitis.  Finish the antibiotics, even if you feel better.  We have sent your urine off for culture to make sure that we have you on the correct antibiotic.  Push electrolyte containing fluids such as Pedialyte, liquid IV until your urine is clear.  Follow-up with the primary care provider we set you up with prior to discharge.  Go immediately to the ER if you get worse, or unable to keep down any fluids, decreased urine output, passing out, or for any other concerns.

## 2024-01-26 NOTE — ED Provider Notes (Signed)
 HPI  SUBJECTIVE:  Rhonda Fox is a 23 y.o. female who presents with 2 days of fevers Tmax 102.9, body aches, crampy periumbilical abdominal pain, and intermittent nausea, send of 15 episodes of watery, nonbloody diarrhea, headache and sore throat.  She also reports dysuria, cloudy urine, bilateral back pain that radiates down her flanks to the front.  No vomiting, nasal congestion, rhinorrhea, coughing, wheezing or shortness of breath, urinary urgency, frequency, odorous urine, hematuria.  No vaginal odor, bleeding, discharge.  No anorexia, abdominal distention.  She has had exposure to multiple coworkers with a stomach bug at work earlier in the week.  No recent travel, raw or undercooked foods, questionable leftovers, recent antibiotics.  She has been taking ibuprofen , Tylenol , and an unknown antidiarrheal with improvement in her symptoms.  Last dose of ibuprofen  was within the past 6 hours.  She states symptoms are worse when she lies down on her right side before.  She has had similar diarrhea before which was thought to be due to ulcerative colitis.  She has a past medical history of UTI, autism, anxiety and ulcerative colitis, but is currently not on any medications for it.  States has never been evaluated by GI for this.  No history of abdominal surgeries, gallbladder disease, liver disease, pancreatitis.  LMP: Last week.  Denies possibility of being pregnant.  PCP: None   Past Medical History:  Diagnosis Date   Anxiety    Autism    Pectus excavatum     History reviewed. No pertinent surgical history.  Family History  Problem Relation Age of Onset   Alcohol abuse Brother    Suicidality Paternal Uncle     Social History   Tobacco Use   Smoking status: Never   Smokeless tobacco: Never  Vaping Use   Vaping status: Never Used  Substance Use Topics   Alcohol use: Not Currently    Comment: Socially   Drug use: Yes    Types: Marijuana     Current Facility-Administered  Medications:    cefTRIAXone  (ROCEPHIN ) injection 1 g, 1 g, Intramuscular, Once, Van Knee, MD  Current Outpatient Medications:    cefdinir  (OMNICEF ) 300 MG capsule, Take 1 capsule (300 mg total) by mouth 2 (two) times daily., Disp: 20 capsule, Rfl: 0   ondansetron  (ZOFRAN -ODT) 4 MG disintegrating tablet, Take 1 tablet (4 mg total) by mouth every 8 (eight) hours as needed for nausea or vomiting., Disp: 20 tablet, Rfl: 0   amitriptyline  (ELAVIL ) 25 MG tablet, Take 1 tablet (25 mg total) by mouth at bedtime. (Patient not taking: Reported on 11/08/2022), Disp: 30 tablet, Rfl: 2   dicyclomine  (BENTYL ) 10 MG capsule, Take 1 capsule (10 mg total) by mouth 4 (four) times daily -  before meals and at bedtime for 7 days., Disp: 28 capsule, Rfl: 0   loperamide  (IMODIUM  A-D) 2 MG tablet, Take 1 tablet (2 mg total) by mouth 4 (four) times daily as needed., Disp: 30 tablet, Rfl: 0  Allergies  Allergen Reactions   Kiwi Extract Anaphylaxis     ROS  As noted in HPI.   Physical Exam  BP 108/68 (BP Location: Left Arm)   Pulse 63   Temp 98.2 F (36.8 C) (Oral)   Resp 14   Ht 5' (1.524 m)   Wt 49.9 kg   LMP 01/18/2024 (Approximate)   SpO2 100%   BMI 21.48 kg/m   Constitutional: Well developed, well nourished, no acute distress.  Appears well-hydrated. Eyes: PERRL, EOMI, conjunctiva normal  bilaterally HENT: Normocephalic, atraumatic,mucus membranes moist Respiratory: Clear to auscultation bilaterally, no rales, no wheezing, no rhonchi Cardiovascular: Normal rate and rhythm, no murmurs, no gallops, no rubs GI: Soft, nondistended, normal bowel sounds, flat, no rebound, no guarding.  Positive suprapubic, bilateral flank tenderness.  Mild periumbilical tenderness.  No right lower quadrant tenderness.  Negative Murphy.  Moving around the table comfortably. Back: Bilateral CVAT skin: No rash, skin intact Musculoskeletal: No edema, no tenderness, no deformities Neurologic: Alert & oriented x 3,  CN III-XII grossly intact, no motor deficits, sensation grossly intact Psychiatric: Speech and behavior appropriate   ED Course   Medications  cefTRIAXone  (ROCEPHIN ) injection 1 g (has no administration in time range)    Orders Placed This Encounter  Procedures   Group A Strep by PCR    Standing Status:   Standing    Number of Occurrences:   1   Resp Panel by RT-PCR (Flu A&B, Covid) Anterior Nasal Swab    Standing Status:   Standing    Number of Occurrences:   1   Urine Culture    Standing Status:   Standing    Number of Occurrences:   1    Indication:   Dysuria   Urinalysis, w/ Reflex to Culture (Infection Suspected) -Urine, Clean Catch    Standing Status:   Standing    Number of Occurrences:   1    Specimen Source:   Urine, Clean Catch [[id number]]   Nursing Communication Please set up with a PCP prior to discharge    Please set up with a PCP prior to discharge    Standing Status:   Standing    Number of Occurrences:   1   Results for orders placed or performed during the hospital encounter of 01/26/24 (from the past 24 hours)  Group A Strep by PCR     Status: None   Collection Time: 01/26/24  3:21 PM   Specimen: Throat; Sterile Swab  Result Value Ref Range   Group A Strep by PCR NOT DETECTED NOT DETECTED  Urinalysis, w/ Reflex to Culture (Infection Suspected) -Urine, Clean Catch     Status: Abnormal   Collection Time: 01/26/24  3:21 PM  Result Value Ref Range   Specimen Source URINE, CLEAN CATCH    Color, Urine YELLOW YELLOW   APPearance CLEAR CLEAR   Specific Gravity, Urine 1.025 1.005 - 1.030   pH 5.5 5.0 - 8.0   Glucose, UA NEGATIVE NEGATIVE mg/dL   Hgb urine dipstick SMALL (A) NEGATIVE   Bilirubin Urine SMALL (A) NEGATIVE   Ketones, ur 80 (A) NEGATIVE mg/dL   Protein, ur NEGATIVE NEGATIVE mg/dL   Nitrite NEGATIVE NEGATIVE   Leukocytes,Ua TRACE (A) NEGATIVE   Squamous Epithelial / HPF 0-5 0 - 5 /HPF   WBC, UA 0-5 0 - 5 WBC/hpf   RBC / HPF 0-5 0 - 5 RBC/hpf    Bacteria, UA FEW (A) NONE SEEN  Resp Panel by RT-PCR (Flu A&B, Covid) Anterior Nasal Swab     Status: None   Collection Time: 01/26/24  3:21 PM   Specimen: Anterior Nasal Swab  Result Value Ref Range   SARS Coronavirus 2 by RT PCR NEGATIVE NEGATIVE   Influenza A by PCR NEGATIVE NEGATIVE   Influenza B by PCR NEGATIVE NEGATIVE   No results found.  ED Clinical Impression  1. Pyelonephritis   2. Diarrhea, unspecified type   3. Lab test negative for COVID-19 virus      ED  Assessment/Plan     COVID, flu, strep negative.  Discussed with patient while in department  UA consistent with UTI with few bacteria, leukocytes, some ketones and small hematuria.  Sending off culture to confirm antibiotic choice.  Presentation consistent with pyelonephritis with fevers, body aches, suprapubic, flank and bilateral CVAT.  Giving Rocephin  1000 mg IM x 1 here.  Will send home Omnicef  300 mg p.o. twice daily for 10 days.  Zofran , push electrolyte containing fluids.  Tylenol  1000 mg 3-4 times a day.  No ibuprofen  given history of ulcerative colitis.  Giving patient a packet of liquid IV.  Pyridium 200 mg 3 times daily as needed for dysuria.  Also suspect that she has concomitant gastroenteritis with the diarrhea and recent exposure to multiple coworkers with GI bug, although ulcerative colitis flare in the differential.  However, she denies bloody stools.  Her abdomen is benign.  No evidence of appendicitis, pancreatitis, hepatitis, perforation, obstruction.  She does not appear to be significantly dehydrated.  She is tolerating p.o.  Low suspicion for GYN cause of her symptoms.  Home with Zofran  to help reduce frequency of diarrhea.  Will set up with a PCP prior to discharge.  Strict ER return precautions given the patient.  Work note for 3 days.  Discussed labs, , MDM, treatment plan, and plan for follow-up with patient Discussed sn/sx that should prompt return to the ED. patient agrees with plan.    Meds ordered this encounter  Medications   cefTRIAXone  (ROCEPHIN ) injection 1 g   cefdinir  (OMNICEF ) 300 MG capsule    Sig: Take 1 capsule (300 mg total) by mouth 2 (two) times daily.    Dispense:  20 capsule    Refill:  0   ondansetron  (ZOFRAN -ODT) 4 MG disintegrating tablet    Sig: Take 1 tablet (4 mg total) by mouth every 8 (eight) hours as needed for nausea or vomiting.    Dispense:  20 tablet    Refill:  0      *This clinic note was created using Scientist, clinical (histocompatibility and immunogenetics). Therefore, there may be occasional mistakes despite careful proofreading. ?    Van Knee, MD 01/27/24 [phone]

## 2024-01-30 ENCOUNTER — Ambulatory Visit (HOSPITAL_COMMUNITY): Payer: Self-pay

## 2024-01-30 LAB — URINE CULTURE: Culture: 60000 — AB

## 2024-01-30 MED ORDER — NITROFURANTOIN MONOHYD MACRO 100 MG PO CAPS: 100.0000 mg | ORAL_CAPSULE | Freq: Two times a day (BID) | ORAL | 0 refills | Status: AC

## 2024-06-04 ENCOUNTER — Emergency Department
Admission: EM | Admit: 2024-06-04 | Discharge: 2024-06-04 | Disposition: A | Discharge status: Home / Self Care | Payer: Self-pay | Attending: Emergency Medicine | Admitting: Emergency Medicine

## 2024-06-04 ENCOUNTER — Emergency Department: Payer: Self-pay

## 2024-06-04 ENCOUNTER — Other Ambulatory Visit: Payer: Self-pay

## 2024-06-04 DIAGNOSIS — M25532 Pain in left wrist: Secondary | ICD-10-CM | POA: Insufficient documentation

## 2024-06-04 MED ORDER — MELOXICAM 15 MG PO TABS: 15.0000 mg | ORAL_TABLET | Freq: Every day | ORAL | 0 refills | Status: AC

## 2024-06-04 MED ORDER — IBUPROFEN 800 MG PO TABS
800.0000 mg | ORAL_TABLET | Freq: Once | ORAL | Status: AC
  Administered 2024-06-04: 800 mg via ORAL
  Filled 2024-06-04: qty 1

## 2024-06-04 NOTE — ED Triage Notes (Signed)
 Pt to ED via POV from work. Pt reports left wrist pain x5 days. No injury. Pt reports this occurred a few months ago as well but went away. Pt reports knot in wrist area.

## 2024-06-04 NOTE — ED Provider Notes (Signed)
 "   [facility] Emergency Department Provider Note     Event Date/Time   First MD Initiated Contact with Patient 06/04/24 1731     (approximate)   History   Wrist Pain   HPI  Rhonda Fox is Fox 24 y.o. female presents to the ED for evaluation of nontraumatic left wrist pain x 5 days.  Unknown injury.  Patient reports she feels Fox knot in her wrist.  She notes the pain occurred Fox few months ago, completely resolved and now has returned.  She has been taking Tylenol  with no relief.     Physical Exam   Triage Vital Signs: ED Triage Vitals  Encounter Vitals Group     BP 06/04/24 1657 116/75     Girls Systolic BP Percentile --      Girls Diastolic BP Percentile --      Boys Systolic BP Percentile --      Boys Diastolic BP Percentile --      Pulse Rate 06/04/24 1657 81     Resp 06/04/24 1657 18     Temp 06/04/24 1657 99 F (37.2 C)     Temp Source 06/04/24 1657 Oral     SpO2 06/04/24 1657 98 %     Weight --      Height --      Head Circumference --      Peak Flow --      Pain Score 06/04/24 1656 10     Pain Loc --      Pain Education --      Exclude from Growth Chart --     Most recent vital signs: Vitals:   06/04/24 1657  BP: 116/75  Pulse: 81  Resp: 18  Temp: 99 F (37.2 C)  SpO2: 98%    General Awake, no distress.  HEENT NCAT. CV:  Good peripheral perfusion.  RESP:  Normal effort.  ABD:  No distention.  Other:  No visible deformity to left wrist or hand.  Over the distal radial volar aspect of the forearm I am able to appreciate approximately 1 cm deep lump.  Tender to palpation. (+) Finkelstein test.  Radial pulses palpated bilaterally and are equal 2+.  Neurovascular status intact all throughout.   ED Results / Procedures / Treatments   Labs (all labs ordered are listed, but only abnormal results are displayed) Labs Reviewed - No data to display  RADIOLOGY  I personally viewed and evaluated these images as part of my  medical decision making, as well as reviewing the written report by the radiologist.  ED Provider Interpretation: No acute bony abnormality noted.  DG Wrist Complete Left Result Date: 06/04/2024 CLINICAL DATA:  Left wrist pain for several months. EXAM: LEFT WRIST - COMPLETE 3+ VIEW COMPARISON:  None Available. FINDINGS: There is no evidence of fracture or dislocation. There is no evidence of arthropathy or other focal bone abnormality. Soft tissues are unremarkable. IMPRESSION: Negative. Electronically Signed   By: Rhonda Fox M.D.   On: 06/04/2024 17:21    PROCEDURES:  Critical Care performed: No  Procedures   MEDICATIONS ORDERED IN ED: Medications  ibuprofen  (ADVIL ) tablet 800 mg (800 mg Oral Given 06/04/24 1800)     IMPRESSION / MDM / ASSESSMENT AND PLAN / ED COURSE  I reviewed the triage vital signs and the nursing notes.  Clinical Course as of 06/04/24 1907  Thu Jun 04, 2024  1745 DG Wrist Complete Left IMPRESSION: Negative.   [MH]    Clinical Course User Index [MH] Rhonda Monte A, PA-C    24 y.o. female presents to the emergency department for evaluation and treatment of left wrist pain without injury. See HPI for further details.   Differential diagnosis includes, but is not limited to occult ganglion cyst, fracture, sprain, tendinitis  Patient's presentation is most consistent with acute, uncomplicated illness.  Patient is alert and oriented.  She is hemodynamic stable.  On initial assessment she is in no acute distress.  Physical exam findings are stated above.  X-rays are reassuring.  Presentation most consistent with possible occult ganglion cyst versus tendinitis.  Plan to place patient in wrist brace and have her follow-up with orthopedics.  Prescription for meloxicam  sent to pharmacy.  The patient is in agreement with this care plan.  She is in stable condition for discharge home.  ED return precautions discussed.   FINAL  CLINICAL IMPRESSION(S) / ED DIAGNOSES   Final diagnoses:  Acute pain of left wrist   Rx / DC Orders   ED Discharge Orders          Ordered    meloxicam  (MOBIC ) 15 MG tablet  Daily        06/04/24 1821             Note:  This document was prepared using Dragon voice recognition software and may include unintentional dictation errors.    Rhonda, Mileigh Tilley A, PA-C 06/04/24 Rhonda Viviann Pastor, MD 06/04/24 2243  "

## 2024-06-04 NOTE — Discharge Instructions (Addendum)
 Your wrist x-ray is normal.  Keep wrist brace in place for comfort. Follow up with orthopedics in 1 week.  Pain control:  Ibuprofen  (motrin /aleve /advil ) - You can take 3 tablets (600 mg) every 6 hours as needed for pain/fever.  Acetaminophen  (tylenol ) - You can take 2 extra strength tablets (1000 mg) every 6 hours as needed for pain/fever.  You can alternate these medications or take them together.  Make sure you eat food/drink water when taking these medications.  Do not take ibuprofen  with prescribed medication meloxicam .
